# Patient Record
Sex: Female | Born: 1985 | Race: Black or African American | Hispanic: No | Marital: Married | Smoking: Never smoker
Health system: Southern US, Community
[De-identification: ages and names within clinical notes are randomized; demographics above are authoritative.]

## PROBLEM LIST (undated history)

## (undated) ENCOUNTER — Inpatient Hospital Stay (HOSPITAL_COMMUNITY): Payer: Self-pay

## (undated) DIAGNOSIS — G43909 Migraine, unspecified, not intractable, without status migrainosus: Secondary | ICD-10-CM

## (undated) DIAGNOSIS — M1990 Unspecified osteoarthritis, unspecified site: Secondary | ICD-10-CM

## (undated) DIAGNOSIS — J45909 Unspecified asthma, uncomplicated: Secondary | ICD-10-CM

## (undated) DIAGNOSIS — F419 Anxiety disorder, unspecified: Secondary | ICD-10-CM

## (undated) DIAGNOSIS — R030 Elevated blood-pressure reading, without diagnosis of hypertension: Secondary | ICD-10-CM

## (undated) DIAGNOSIS — F32A Depression, unspecified: Secondary | ICD-10-CM

## (undated) HISTORY — PX: NO PAST SURGERIES: SHX2092

---

## 2005-11-15 ENCOUNTER — Ambulatory Visit: Payer: Self-pay | Admitting: Emergency Medicine

## 2005-11-15 ENCOUNTER — Emergency Department: Payer: Self-pay | Admitting: Emergency Medicine

## 2009-10-12 ENCOUNTER — Inpatient Hospital Stay (HOSPITAL_COMMUNITY): Admission: AD | Admit: 2009-10-12 | Discharge: 2009-10-12 | Payer: Self-pay | Admitting: Obstetrics & Gynecology

## 2009-11-03 ENCOUNTER — Inpatient Hospital Stay (HOSPITAL_COMMUNITY): Admission: AD | Admit: 2009-11-03 | Discharge: 2009-11-05 | Payer: Self-pay | Admitting: Obstetrics and Gynecology

## 2011-04-03 LAB — RPR: RPR Ser Ql: NONREACTIVE

## 2011-04-03 LAB — CBC
MCHC: 34.6 g/dL (ref 30.0–36.0)
MCV: 98.5 fL (ref 78.0–100.0)
Platelets: 178 10*3/uL (ref 150–400)
Platelets: 212 10*3/uL (ref 150–400)
RDW: 11.7 % (ref 11.5–15.5)
RDW: 11.9 % (ref 11.5–15.5)
WBC: 7.9 10*3/uL (ref 4.0–10.5)

## 2014-02-21 ENCOUNTER — Emergency Department (HOSPITAL_COMMUNITY)
Admission: EM | Admit: 2014-02-21 | Discharge: 2014-02-21 | Disposition: A | Discharge status: Home / Self Care | Payer: No Typology Code available for payment source | Attending: Emergency Medicine | Admitting: Emergency Medicine

## 2014-02-21 ENCOUNTER — Encounter (HOSPITAL_COMMUNITY): Payer: Self-pay | Admitting: Emergency Medicine

## 2014-02-21 DIAGNOSIS — S161XXA Strain of muscle, fascia and tendon at neck level, initial encounter: Secondary | ICD-10-CM

## 2014-02-21 DIAGNOSIS — S139XXA Sprain of joints and ligaments of unspecified parts of neck, initial encounter: Secondary | ICD-10-CM | POA: Insufficient documentation

## 2014-02-21 DIAGNOSIS — Y929 Unspecified place or not applicable: Secondary | ICD-10-CM | POA: Insufficient documentation

## 2014-02-21 DIAGNOSIS — Y9389 Activity, other specified: Secondary | ICD-10-CM | POA: Insufficient documentation

## 2014-02-21 DIAGNOSIS — S0990XA Unspecified injury of head, initial encounter: Secondary | ICD-10-CM | POA: Insufficient documentation

## 2014-02-21 MED ORDER — HYDROCODONE-ACETAMINOPHEN 5-325 MG PO TABS: 2.0000 | ORAL_TABLET | Freq: Four times a day (QID) | ORAL | Status: DC | PRN

## 2014-02-21 MED ORDER — HYDROCODONE-ACETAMINOPHEN 5-325 MG PO TABS
2.0000 | ORAL_TABLET | Freq: Once | ORAL | Status: AC
  Administered 2014-02-21: 2 via ORAL
  Filled 2014-02-21: qty 2

## 2014-02-21 MED ORDER — CYCLOBENZAPRINE HCL 10 MG PO TABS: 10.0000 mg | ORAL_TABLET | Freq: Two times a day (BID) | ORAL | Status: DC | PRN

## 2014-02-21 NOTE — Discharge Instructions (Signed)
Cervical Strain and Sprain (Whiplash) °with Rehab °Cervical strain and sprains are injuries that commonly occur with "whiplash" injuries. Whiplash occurs when the neck is forcefully whipped backward or forward, such as during a motor vehicle accident. The muscles, ligaments, tendons, discs and nerves of the neck are susceptible to injury when this occurs. °SYMPTOMS  °· Pain or stiffness in the front and/or back of neck °· Symptoms may present immediately or up to 24 hours after injury. °· Dizziness, headache, nausea and vomiting. °· Muscle spasm with soreness and stiffness in the neck. °· Tenderness and swelling at the injury site. °CAUSES  °Whiplash injuries often occur during contact sports or motor vehicle accidents.  °RISK INCREASES WITH: °· Osteoarthritis of the spine. °· Situations that make head or neck accidents or trauma more likely. °· High-risk sports (football, rugby, wrestling, hockey, auto racing, gymnastics, diving, contact karate or boxing). °· Poor strength and flexibility of the neck. °· Previous neck injury. °· Poor tackling technique. °· Improperly fitted or padded equipment. °PREVENTION °· Learn and use proper technique (avoid tackling with the head, spearing and head-butting; use proper falling techniques to avoid landing on the head). °· Warm up and stretch properly before activity. °· Maintain physical fitness: °· Strength, flexibility and endurance. °· Cardiovascular fitness. °· Wear properly fitted and padded protective equipment, such as padded soft collars, for participation in contact sports. °PROGNOSIS  °Recovery for cervical strain and sprain injuries is dependent on the extent of the injury. These injuries are usually curable in 1 week to 3 months with appropriate treatment.  °RELATED COMPLICATIONS  °· Temporary numbness and weakness may occur if the nerve roots are damaged, and this may persist until the nerve has completely healed. °· Chronic pain due to frequent recurrence of  symptoms. °· Prolonged healing, especially if activity is resumed too soon (before complete recovery). °TREATMENT  °Treatment initially involves the use of ice and medication to help reduce pain and inflammation. It is also important to perform strengthening and stretching exercises and modify activities that worsen symptoms so the injury does not get worse. These exercises may be performed at home or with a therapist. For patients who experience severe symptoms, a soft padded collar may be recommended to be worn around the neck.  °Improving your posture may help reduce symptoms. Posture improvement includes pulling your chin and abdomen in while sitting or standing. If you are sitting, sit in a firm chair with your buttocks against the back of the chair. While sleeping, try replacing your pillow with a small towel rolled to 2 inches in diameter, or use a cervical pillow or soft cervical collar. Poor sleeping positions delay healing.  °For patients with nerve root damage, which causes numbness or weakness, the use of a cervical traction apparatus may be recommended. Surgery is rarely necessary for these injuries. However, cervical strain and sprains that are present at birth (congenital) may require surgery. °MEDICATION  °· If pain medication is necessary, nonsteroidal anti-inflammatory medications, such as aspirin and ibuprofen, or other minor pain relievers, such as acetaminophen, are often recommended. °· Do not take pain medication for 7 days before surgery. °· Prescription pain relievers may be given if deemed necessary by your caregiver. Use only as directed and only as much as you need. °HEAT AND COLD:  °· Cold treatment (icing) relieves pain and reduces inflammation. Cold treatment should be applied for 10 to 15 minutes every 2 to 3 hours for inflammation and pain and immediately after any activity that   aggravates your symptoms. Use ice packs or an ice massage. °· Heat treatment may be used prior to  performing the stretching and strengthening activities prescribed by your caregiver, physical therapist, or athletic trainer. Use a heat pack or a warm soak. °SEEK MEDICAL CARE IF:  °· Symptoms get worse or do not improve in 2 weeks despite treatment. °· New, unexplained symptoms develop (drugs used in treatment may produce side effects). °EXERCISES °RANGE OF MOTION (ROM) AND STRETCHING EXERCISES - Cervical Strain and Sprain °These exercises may help you when beginning to rehabilitate your injury. In order to successfully resolve your symptoms, you must improve your posture. These exercises are designed to help reduce the forward-head and rounded-shoulder posture which contributes to this condition. Your symptoms may resolve with or without further involvement from your physician, physical therapist or athletic trainer. While completing these exercises, remember:  °· Restoring tissue flexibility helps normal motion to return to the joints. This allows healthier, less painful movement and activity. °· An effective stretch should be held for at least 20 seconds, although you may need to begin with shorter hold times for comfort. °· A stretch should never be painful. You should only feel a gentle lengthening or release in the stretched tissue. °STRETCH- Axial Extensors °· Lie on your back on the floor. You may bend your knees for comfort. Place a rolled up hand towel or dish towel, about 2 inches in diameter, under the part of your head that makes contact with the floor. °· Gently tuck your chin, as if trying to make a "double chin," until you feel a gentle stretch at the base of your head. °· Hold __________ seconds. °Repeat __________ times. Complete this exercise __________ times per day.  °STRETECH - Axial Extension  °· Stand or sit on a firm surface. Assume a good posture: chest up, shoulders drawn back, abdominal muscles slightly tense, knees unlocked (if standing) and feet hip width apart. °· Slowly retract your  chin so your head slides back and your chin slightly lowers.Continue to look straight ahead. °· You should feel a gentle stretch in the back of your head. Be certain not to feel an aggressive stretch since this can cause headaches later. °· Hold for __________ seconds. °Repeat __________ times. Complete this exercise __________ times per day. °STRETCH  Cervical Side Bend  °· Stand or sit on a firm surface. Assume a good posture: chest up, shoulders drawn back, abdominal muscles slightly tense, knees unlocked (if standing) and feet hip width apart. °· Without letting your nose or shoulders move, slowly tip your right / left ear to your shoulder until your feel a gentle stretch in the muscles on the opposite side of your neck. °· Hold __________ seconds. °Repeat __________ times. Complete this exercise __________ times per day. °STRETCH  Cervical Rotators  °· Stand or sit on a firm surface. Assume a good posture: chest up, shoulders drawn back, abdominal muscles slightly tense, knees unlocked (if standing) and feet hip width apart. °· Keeping your eyes level with the ground, slowly turn your head until you feel a gentle stretch along the back and opposite side of your neck. °· Hold __________ seconds. °Repeat __________ times. Complete this exercise __________ times per day. °RANGE OF MOTION - Neck Circles  °· Stand or sit on a firm surface. Assume a good posture: chest up, shoulders drawn back, abdominal muscles slightly tense, knees unlocked (if standing) and feet hip width apart. °· Gently roll your head down and around from   the back of one shoulder to the back of the other. The motion should never be forced or painful. °· Repeat the motion 10-20 times, or until you feel the neck muscles relax and loosen. °Repeat __________ times. Complete the exercise __________ times per day. °STRENGTHENING EXERCISES - Cervical Strain and Sprain °These exercises may help you when beginning to rehabilitate your injury. They may  resolve your symptoms with or without further involvement from your physician, physical therapist or athletic trainer. While completing these exercises, remember:  °· Muscles can gain both the endurance and the strength needed for everyday activities through controlled exercises. °· Complete these exercises as instructed by your physician, physical therapist or athletic trainer. Progress the resistance and repetitions only as guided. °· You may experience muscle soreness or fatigue, but the pain or discomfort you are trying to eliminate should never worsen during these exercises. If this pain does worsen, stop and make certain you are following the directions exactly. If the pain is still present after adjustments, discontinue the exercise until you can discuss the trouble with your clinician. °STRENGTH Cervical Flexors, Isometric °· Face a wall, standing about 6 inches away. Place a small pillow, a ball about 6-8 inches in diameter, or a folded towel between your forehead and the wall. °· Slightly tuck your chin and gently push your forehead into the soft object. Push only with mild to moderate intensity, building up tension gradually. Keep your jaw and forehead relaxed. °· Hold 10 to 20 seconds. Keep your breathing relaxed. °· Release the tension slowly. Relax your neck muscles completely before you start the next repetition. °Repeat __________ times. Complete this exercise __________ times per day. °STRENGTH- Cervical Lateral Flexors, Isometric  °· Stand about 6 inches away from a wall. Place a small pillow, a ball about 6-8 inches in diameter, or a folded towel between the side of your head and the wall. °· Slightly tuck your chin and gently tilt your head into the soft object. Push only with mild to moderate intensity, building up tension gradually. Keep your jaw and forehead relaxed. °· Hold 10 to 20 seconds. Keep your breathing relaxed. °· Release the tension slowly. Relax your neck muscles completely before  you start the next repetition. °Repeat __________ times. Complete this exercise __________ times per day. °STRENGTH  Cervical Extensors, Isometric  °· Stand about 6 inches away from a wall. Place a small pillow, a ball about 6-8 inches in diameter, or a folded towel between the back of your head and the wall. °· Slightly tuck your chin and gently tilt your head back into the soft object. Push only with mild to moderate intensity, building up tension gradually. Keep your jaw and forehead relaxed. °· Hold 10 to 20 seconds. Keep your breathing relaxed. °· Release the tension slowly. Relax your neck muscles completely before you start the next repetition. °Repeat __________ times. Complete this exercise __________ times per day. °POSTURE AND BODY MECHANICS CONSIDERATIONS - Cervical Strain and Sprain °Keeping correct posture when sitting, standing or completing your activities will reduce the stress put on different body tissues, allowing injured tissues a chance to heal and limiting painful experiences. The following are general guidelines for improved posture. Your physician or physical therapist will provide you with any instructions specific to your needs. While reading these guidelines, remember: °· The exercises prescribed by your provider will help you have the flexibility and strength to maintain correct postures. °· The correct posture provides the optimal environment for your joints to   work. All of your joints have less wear and tear when properly supported by a spine with good posture. This means you will experience a healthier, less painful body. °· Correct posture must be practiced with all of your activities, especially prolonged sitting and standing. Correct posture is as important when doing repetitive low-stress activities (typing) as it is when doing a single heavy-load activity (lifting). °PROLONGED STANDING WHILE SLIGHTLY LEANING FORWARD °When completing a task that requires you to lean forward while  standing in one place for a long time, place either foot up on a stationary 2-4 inch high object to help maintain the best posture. When both feet are on the ground, the low back tends to lose its slight inward curve. If this curve flattens (or becomes too large), then the back and your other joints will experience too much stress, fatigue more quickly and can cause pain.  °RESTING POSITIONS °Consider which positions are most painful for you when choosing a resting position. If you have pain with flexion-based activities (sitting, bending, stooping, squatting), choose a position that allows you to rest in a less flexed posture. You would want to avoid curling into a fetal position on your side. If your pain worsens with extension-based activities (prolonged standing, working overhead), avoid resting in an extended position such as sleeping on your stomach. Most people will find more comfort when they rest with their spine in a more neutral position, neither too rounded nor too arched. Lying on a non-sagging bed on your side with a pillow between your knees, or on your back with a pillow under your knees will often provide some relief. Keep in mind, being in any one position for a prolonged period of time, no matter how correct your posture, can still lead to stiffness. °WALKING °Walk with an upright posture. Your ears, shoulders and hips should all line-up. °OFFICE WORK °When working at a desk, create an environment that supports good, upright posture. Without extra support, muscles fatigue and lead to excessive strain on joints and other tissues. °CHAIR: °· A chair should be able to slide under your desk when your back makes contact with the back of the chair. This allows you to work closely. °· The chair's height should allow your eyes to be level with the upper part of your monitor and your hands to be slightly lower than your elbows. °· Body position: °· Your feet should make contact with the floor. If this is  not possible, use a foot rest. °· Keep your ears over your shoulders. This will reduce stress on your neck and low back. °Document Released: 12/16/2005 Document Revised: 04/12/2013 Document Reviewed: 03/30/2009 °[employer]® Patient Information ©2014 [employer]. °Motor Vehicle Collision  °It is common to have multiple bruises and sore muscles after a motor vehicle collision (MVC). These tend to feel worse for the first 24 hours. You may have the most stiffness and soreness over the first several hours. You may also feel worse when you wake up the first morning after your collision. After this point, you will usually begin to improve with each day. The speed of improvement often depends on the severity of the collision, the number of injuries, and the location and nature of these injuries. °HOME CARE INSTRUCTIONS  °· Put ice on the injured area. °· Put ice in a plastic bag. °· Place a towel between your skin and the bag. °· Leave the ice on for 15-20 minutes, 03-04 times a day. °· Drink enough fluids to   keep your urine clear or pale yellow. Do not drink alcohol. °· Take a warm shower or bath once or twice a day. This will increase blood flow to sore muscles. °· You may return to activities as directed by your caregiver. Be careful when lifting, as this may aggravate neck or back pain. °· Only take over-the-counter or prescription medicines for pain, discomfort, or fever as directed by your caregiver. Do not use aspirin. This may increase bruising and bleeding. °SEEK IMMEDIATE MEDICAL CARE IF: °· You have numbness, tingling, or weakness in the arms or legs. °· You develop severe headaches not relieved with medicine. °· You have severe neck pain, especially tenderness in the middle of the back of your neck. °· You have changes in bowel or bladder control. °· There is increasing pain in any area of the body. °· You have shortness of breath, lightheadedness, dizziness, or fainting. °· You have chest pain. °· You feel  sick to your stomach (nauseous), throw up (vomit), or sweat. °· You have increasing abdominal discomfort. °· There is blood in your urine, stool, or vomit. °· You have pain in your shoulder (shoulder strap areas). °· You feel your symptoms are getting worse. °MAKE SURE YOU:  °· Understand these instructions. °· Will watch your condition. °· Will get help right away if you are not doing well or get worse. °Document Released: 12/16/2005 Document Revised: 03/09/2012 Document Reviewed: 05/15/2011 °[employer]® Patient Information ©2014 [employer]. ° °
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
## 2014-02-21 NOTE — ED Provider Notes (Signed)
Medical screening examination/treatment/procedure(s) were performed by non-physician practitioner and as supervising physician I was immediately available for consultation/collaboration.  EKG Interpretation   None         Jayelle Page L Shameka Aggarwal, MD 02/21/14 1521 
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
## 2014-02-28 ENCOUNTER — Other Ambulatory Visit (HOSPITAL_COMMUNITY): Payer: Self-pay | Admitting: Chiropractic Medicine

## 2014-02-28 ENCOUNTER — Ambulatory Visit (HOSPITAL_COMMUNITY)
Admission: RE | Admit: 2014-02-28 | Discharge: 2014-02-28 | Disposition: A | Discharge status: Home / Self Care | Payer: No Typology Code available for payment source | Source: Ambulatory Visit | Attending: Chiropractic Medicine | Admitting: Chiropractic Medicine

## 2014-02-28 DIAGNOSIS — R6884 Jaw pain: Secondary | ICD-10-CM | POA: Insufficient documentation

## 2014-02-28 DIAGNOSIS — M549 Dorsalgia, unspecified: Secondary | ICD-10-CM

## 2014-02-28 DIAGNOSIS — M545 Low back pain, unspecified: Secondary | ICD-10-CM

## 2014-02-28 DIAGNOSIS — M542 Cervicalgia: Secondary | ICD-10-CM

## 2014-08-30 ENCOUNTER — Encounter (HOSPITAL_COMMUNITY): Payer: Self-pay | Admitting: Emergency Medicine

## 2014-08-30 ENCOUNTER — Emergency Department (HOSPITAL_COMMUNITY)
Admission: EM | Admit: 2014-08-30 | Discharge: 2014-08-30 | Disposition: A | Discharge status: Home / Self Care | Payer: Medicaid Other | Attending: Emergency Medicine | Admitting: Emergency Medicine

## 2014-08-30 DIAGNOSIS — Z3490 Encounter for supervision of normal pregnancy, unspecified, unspecified trimester: Secondary | ICD-10-CM

## 2014-08-30 DIAGNOSIS — O210 Mild hyperemesis gravidarum: Secondary | ICD-10-CM | POA: Insufficient documentation

## 2014-08-30 DIAGNOSIS — IMO0002 Reserved for concepts with insufficient information to code with codable children: Secondary | ICD-10-CM | POA: Diagnosis not present

## 2014-08-30 DIAGNOSIS — R112 Nausea with vomiting, unspecified: Secondary | ICD-10-CM

## 2014-08-30 DIAGNOSIS — R630 Anorexia: Secondary | ICD-10-CM | POA: Insufficient documentation

## 2014-08-30 DIAGNOSIS — R51 Headache: Secondary | ICD-10-CM | POA: Diagnosis not present

## 2014-08-30 DIAGNOSIS — R42 Dizziness and giddiness: Secondary | ICD-10-CM | POA: Insufficient documentation

## 2014-08-30 DIAGNOSIS — O9989 Other specified diseases and conditions complicating pregnancy, childbirth and the puerperium: Secondary | ICD-10-CM | POA: Diagnosis present

## 2014-08-30 LAB — COMPREHENSIVE METABOLIC PANEL
ALT: 8 U/L (ref 0–35)
AST: 12 U/L (ref 0–37)
Albumin: 3.7 g/dL (ref 3.5–5.2)
Alkaline Phosphatase: 45 U/L (ref 39–117)
Anion gap: 13 (ref 5–15)
BUN: 9 mg/dL (ref 6–23)
CO2: 21 mEq/L (ref 19–32)
Calcium: 8.8 mg/dL (ref 8.4–10.5)
Chloride: 103 mEq/L (ref 96–112)
Creatinine, Ser: 0.72 mg/dL (ref 0.50–1.10)
GFR calc Af Amer: >90 mL/min (ref >90)
GFR calc non Af Amer: >90 mL/min (ref >90)
Glucose, Bld: 88 mg/dL (ref 70–99)
Potassium: 3.8 mEq/L (ref 3.7–5.3)
Sodium: 137 mEq/L (ref 137–147)
Total Bilirubin: 0.2 mg/dL — ABNORMAL LOW (ref 0.3–1.2)
Total Protein: 7.1 g/dL (ref 6.0–8.3)

## 2014-08-30 LAB — CBC WITH DIFFERENTIAL/PLATELET
Basophils Absolute: 0 10*3/uL (ref 0.0–0.1)
Basophils Relative: 0 % (ref 0–1)
Eosinophils Absolute: 0 10*3/uL (ref 0.0–0.7)
Eosinophils Relative: 1 % (ref 0–5)
HCT: 37.6 % (ref 36.0–46.0)
Hemoglobin: 13.3 g/dL (ref 12.0–15.0)
Lymphocytes Relative: 34 % (ref 12–46)
Lymphs Abs: 1.7 10*3/uL (ref 0.7–4.0)
MCH: 32.4 pg (ref 26.0–34.0)
MCHC: 35.4 g/dL (ref 30.0–36.0)
MCV: 91.7 fL (ref 78.0–100.0)
Monocytes Absolute: 0.5 10*3/uL (ref 0.1–1.0)
Monocytes Relative: 9 % (ref 3–12)
Neutro Abs: 2.9 10*3/uL (ref 1.7–7.7)
Neutrophils Relative %: 56 % (ref 43–77)
Platelets: 248 10*3/uL (ref 150–400)
RBC: 4.1 MIL/uL (ref 3.87–5.11)
RDW: 11.5 % (ref 11.5–15.5)
WBC: 5.1 10*3/uL (ref 4.0–10.5)

## 2014-08-30 LAB — URINALYSIS, ROUTINE W REFLEX MICROSCOPIC
Bilirubin Urine: NEGATIVE
Glucose, UA: NEGATIVE mg/dL
Ketones, ur: NEGATIVE mg/dL
Leukocytes, UA: NEGATIVE
Nitrite: NEGATIVE
Protein, ur: NEGATIVE mg/dL
Specific Gravity, Urine: 1.028 (ref 1.005–1.030)
Urobilinogen, UA: 1 mg/dL (ref 0.0–1.0)
pH: 5.5 (ref 5.0–8.0)

## 2014-08-30 LAB — URINE MICROSCOPIC-ADD ON

## 2014-08-30 LAB — POC URINE PREG, ED: Preg Test, Ur: POSITIVE — AB

## 2014-08-30 LAB — HCG, QUANTITATIVE, PREGNANCY: hCG, Beta Chain, Quant, S: 64853 m[IU]/mL — ABNORMAL HIGH (ref <5)

## 2014-08-30 MED ORDER — MECLIZINE HCL 25 MG PO TABS: 25.0000 mg | ORAL_TABLET | Freq: Three times a day (TID) | ORAL | Status: DC | PRN

## 2014-08-30 MED ORDER — MECLIZINE HCL 25 MG PO TABS
25.0000 mg | ORAL_TABLET | Freq: Once | ORAL | Status: AC
  Administered 2014-08-30: 25 mg via ORAL
  Filled 2014-08-30: qty 1

## 2014-08-30 MED ORDER — PRENATAL VITAMINS 28-0.8 MG PO TABS: 1.0000 | ORAL_TABLET | Freq: Every day | ORAL | Status: DC

## 2014-08-30 MED ORDER — SODIUM CHLORIDE 0.9 % IV BOLUS (SEPSIS)
1000.0000 mL | Freq: Once | INTRAVENOUS | Status: AC
  Administered 2014-08-30: 1000 mL via INTRAVENOUS

## 2014-08-30 NOTE — ED Notes (Signed)
Iv removed from rt ac, 2x2 gauze applied with tape.

## 2014-08-30 NOTE — ED Notes (Signed)
Pt here for dizziness and morning sickness, sts has had several positive pregnancy test, this is 3rd pregnancy and sts that previous pregnancy did not have dizziness.

## 2014-08-30 NOTE — ED Notes (Signed)
PA at the bedside.

## 2014-08-30 NOTE — ED Provider Notes (Signed)
CSN: [id number]     Arrival date & time 08/30/14  1328 History   First MD Initiated Contact with Patient 08/30/14 1713     Chief Complaint  Patient presents with  . Dizziness     (Consider location/radiation/quality/duration/timing/severity/associated sxs/prior Treatment) HPI Pt is a 28yo female G4T2PA1L2 presenting to ED with c/o 3 day hx of dizziness, nausea, vomiting and intermittent frontal headache. Pt reports 2 episodes of emesis this morning and increased dizziness with certain head movements. States when she walks she feels increased dizziness, "like the room is spinning." denies previous hx of vertigo. States her last pregnancies were uneventful and had a volentary termination of her first pregnancy.  Denies chest, back, abdominal or vaginal pain. Denies urinary symptoms. Denies vaginal bleeding or discharge. Denies other significant PMH. Denies hx of recent illness. Pt does report decreased appetite due to nausea. Denies taking any medications at home for nausea. States she did have a OB/GYN visit scheduled for today [facility] but states she came here instead due to the dizziness. Denies calling health department prior to coming to ED.  History reviewed. No pertinent past medical history. History reviewed. No pertinent past surgical history. History reviewed. No pertinent family history. History  Substance Use Topics  . Smoking status: Never Smoker   . Smokeless tobacco: Not on file  . Alcohol Use: No   OB History   Grav Para Term Preterm Abortions TAB SAB Ect Mult Living                 Review of Systems  Constitutional: Positive for appetite change ( decreased due to nausea). Negative for fever and chills.  Eyes: Negative for photophobia, pain, redness and visual disturbance.  Respiratory: Negative for cough and shortness of breath.   Cardiovascular: Negative for chest pain, palpitations and leg swelling.  Gastrointestinal: Positive  for nausea and vomiting ( x2 today). Negative for abdominal pain, diarrhea and constipation.  Genitourinary: Negative for dysuria, urgency, frequency, hematuria, flank pain, decreased urine volume, vaginal bleeding, vaginal discharge, vaginal pain, menstrual problem and pelvic pain.  Musculoskeletal: Negative for back pain and myalgias.  Neurological: Positive for dizziness and headaches ( intermittent, frontal). Negative for syncope, weakness, light-headedness and numbness.  All other systems reviewed and are negative.     Allergies  Shrimp  Home Medications   Prior to Admission medications   Medication Sig Start Date End Date Taking? Authorizing Provider  cyclobenzaprine (FLEXERIL) 10 MG tablet Take 1 tablet (10 mg total) by mouth 2 (two) times daily as needed for muscle spasms. 02/21/14   Roxy Horseman, PA-C  Fluticasone-Salmeterol (ADVAIR) 250-50 MCG/DOSE AEPB Inhale 2 puffs into the lungs daily as needed (wheezing).    Historical Provider, MD  HYDROcodone-acetaminophen (NORCO/VICODIN) 5-325 MG per tablet Take 2 tablets by mouth every 6 (six) hours as needed. 02/21/14   Roxy Horseman, PA-C  meclizine (ANTIVERT) 25 MG tablet Take 1 tablet (25 mg total) by mouth 3 (three) times daily as needed for dizziness. 08/30/14   Junius Finner, PA-C  Prenatal Vit-Fe Fumarate-FA (PRENATAL VITAMINS) 28-0.8 MG TABS Take 1 capsule by mouth daily. 08/30/14   Junius Finner, PA-C  triprolidine-pseudoephedrine (APRODINE) 2.5-60 MG TABS Take 0.5-1 tablets by mouth daily as needed for allergies.    Historical Provider, MD  vitamin C (ASCORBIC ACID) 500 MG tablet Take 1,000 mg by mouth once.    Historical Provider, MD   BP 110/63  Pulse 57  Temp(Src) 98.6 F (37  C) (Oral)  Resp 15  Ht  (1.6 m)  Wt 145 lb (65.772 kg)  BMI 25.69 kg/m2  SpO2 100%  LMP 07/21/2014 Physical Exam  Nursing note and vitals reviewed. Constitutional: She is oriented to person, place, and time. She appears well-developed and  well-nourished. No distress.  HENT:  Head: Normocephalic and atraumatic.  Eyes: Conjunctivae and EOM are normal. Pupils are equal, round, and reactive to light. No scleral icterus.  Increased dizziness with EOMs and head movement  Neck: Normal range of motion. Neck supple.  No nuchal rigidity or meningeal signs.  Cardiovascular: Normal rate, regular rhythm and normal heart sounds.   Pulmonary/Chest: Effort normal and breath sounds normal. No respiratory distress. She has no wheezes. She has no rales. She exhibits no tenderness.  Abdominal: Soft. Bowel sounds are normal. She exhibits no distension and no mass. There is no tenderness. There is no rebound and no guarding.  Musculoskeletal: Normal range of motion.  Neurological: She is alert and oriented to person, place, and time. She has normal strength. No cranial nerve deficit or sensory deficit. She displays a negative Romberg sign. Gait normal. GCS eye subscore is 4. GCS verbal subscore is 5. GCS motor subscore is 6.  Skin: Skin is warm and dry. She is not diaphoretic.    ED Course  Procedures (including critical care time) Labs Review Labs Reviewed  COMPREHENSIVE METABOLIC PANEL - Abnormal; Notable for the following:    Total Bilirubin 0.2 (*)    All other components within normal limits  URINALYSIS, ROUTINE W REFLEX MICROSCOPIC - Abnormal; Notable for the following:    APPearance CLOUDY (*)    Hgb urine dipstick SMALL (*)    All other components within normal limits  URINE MICROSCOPIC-ADD ON - Abnormal; Notable for the following:    Squamous Epithelial / LPF MANY (*)    Bacteria, UA FEW (*)    All other components within normal limits  HCG, QUANTITATIVE, PREGNANCY - Abnormal; Notable for the following:    hCG, Beta Chain, Quant, S 16109 (*)    All other components within normal limits  POC URINE PREG, ED - Abnormal; Notable for the following:    Preg Test, Ur POSITIVE (*)    All other components within normal limits  CBC WITH  DIFFERENTIAL    Imaging Review No results found.   EKG Interpretation None      MDM   Final diagnoses:  Vertigo  Pregnancy  Non-intractable vomiting with nausea, vomiting of unspecified type    Pt is a 28yo female, pregnant for 4th time, presenting to ED c/o dizziness, worse with ambulating and certain head movements. Also associated with intermittent frontal headache, nausea and vomiting. Pt denies abdominal pain, vaginal pain, bleeding or discharge. Denies urinary symptoms.  Exam consistent with benign paroxysmal positional vertigo. Not concerned for ectopic pregnancy, ovarian torsion, SAH, CVA, or other emergent process taking place at this time. Pt given IV fluids and meclizine which helped with pt's symptoms. Labs reviewed: unremarkable.  Discussed pt with Dr. Juleen China, pt may be discharged home to f/u with OB/GYN.  Pt discharged home with prenatal vitamins and meclizine. Return precautions provided. Pt verbalized understanding and agreement with tx plan.    Junius Finner, PA-C 08/30/14 [id number]

## 2014-08-31 NOTE — ED Provider Notes (Signed)
Medical screening examination/treatment/procedure(s) were performed by non-physician practitioner and as supervising physician I was immediately available for consultation/collaboration.   EKG Interpretation None       Aidian Salomon, MD 08/31/14 0152 

## 2014-09-03 ENCOUNTER — Encounter (HOSPITAL_COMMUNITY): Payer: Self-pay | Admitting: Emergency Medicine

## 2014-09-03 ENCOUNTER — Emergency Department (HOSPITAL_COMMUNITY): Payer: Medicaid Other

## 2014-09-03 ENCOUNTER — Emergency Department (HOSPITAL_COMMUNITY)
Admission: EM | Admit: 2014-09-03 | Discharge: 2014-09-03 | Disposition: A | Discharge status: Home / Self Care | Payer: Medicaid Other | Attending: Emergency Medicine | Admitting: Emergency Medicine

## 2014-09-03 DIAGNOSIS — O368990 Maternal care for other specified fetal problems, unspecified trimester, not applicable or unspecified: Secondary | ICD-10-CM | POA: Diagnosis not present

## 2014-09-03 DIAGNOSIS — Z79899 Other long term (current) drug therapy: Secondary | ICD-10-CM | POA: Insufficient documentation

## 2014-09-03 DIAGNOSIS — IMO0002 Reserved for concepts with insufficient information to code with codable children: Secondary | ICD-10-CM | POA: Diagnosis not present

## 2014-09-03 DIAGNOSIS — O30001 Twin pregnancy, unspecified number of placenta and unspecified number of amniotic sacs, first trimester: Secondary | ICD-10-CM

## 2014-09-03 DIAGNOSIS — O209 Hemorrhage in early pregnancy, unspecified: Secondary | ICD-10-CM | POA: Diagnosis present

## 2014-09-03 DIAGNOSIS — O200 Threatened abortion: Secondary | ICD-10-CM | POA: Diagnosis not present

## 2014-09-03 DIAGNOSIS — O43899 Other placental disorders, unspecified trimester: Secondary | ICD-10-CM | POA: Diagnosis not present

## 2014-09-03 DIAGNOSIS — O30009 Twin pregnancy, unspecified number of placenta and unspecified number of amniotic sacs, unspecified trimester: Secondary | ICD-10-CM | POA: Insufficient documentation

## 2014-09-03 DIAGNOSIS — O468X1 Other antepartum hemorrhage, first trimester: Secondary | ICD-10-CM

## 2014-09-03 DIAGNOSIS — O418X10 Other specified disorders of amniotic fluid and membranes, first trimester, not applicable or unspecified: Secondary | ICD-10-CM

## 2014-09-03 LAB — BASIC METABOLIC PANEL
ANION GAP: 10 (ref 5–15)
BUN: 11 mg/dL (ref 6–23)
CO2: 22 mEq/L (ref 19–32)
Calcium: 8.7 mg/dL (ref 8.4–10.5)
Chloride: 104 mEq/L (ref 96–112)
Creatinine, Ser: 0.75 mg/dL (ref 0.50–1.10)
Glucose, Bld: 94 mg/dL (ref 70–99)
POTASSIUM: 4.1 meq/L (ref 3.7–5.3)
SODIUM: 136 meq/L — AB (ref 137–147)

## 2014-09-03 LAB — WET PREP, GENITAL
Trich, Wet Prep: NONE SEEN
Yeast Wet Prep HPF POC: NONE SEEN

## 2014-09-03 LAB — OB RESULTS CONSOLE GC/CHLAMYDIA
Chlamydia: NEGATIVE
Gonorrhea: NEGATIVE

## 2014-09-03 LAB — CBC
HCT: 36.1 % (ref 36.0–46.0)
HEMOGLOBIN: 13.1 g/dL (ref 12.0–15.0)
MCH: 32.3 pg (ref 26.0–34.0)
MCHC: 36.3 g/dL — ABNORMAL HIGH (ref 30.0–36.0)
MCV: 89.1 fL (ref 78.0–100.0)
Platelets: 251 10*3/uL (ref 150–400)
RBC: 4.05 MIL/uL (ref 3.87–5.11)
RDW: 11.5 % (ref 11.5–15.5)
WBC: 5.4 10*3/uL (ref 4.0–10.5)

## 2014-09-03 LAB — URINALYSIS, ROUTINE W REFLEX MICROSCOPIC
Bilirubin Urine: NEGATIVE
Glucose, UA: NEGATIVE mg/dL
KETONES UR: NEGATIVE mg/dL
LEUKOCYTES UA: NEGATIVE
NITRITE: NEGATIVE
Protein, ur: NEGATIVE mg/dL
Specific Gravity, Urine: 1.03 (ref 1.005–1.030)
UROBILINOGEN UA: 0.2 mg/dL (ref 0.0–1.0)
pH: 5.5 (ref 5.0–8.0)

## 2014-09-03 LAB — URINE MICROSCOPIC-ADD ON

## 2014-09-03 LAB — ABO/RH: ABO/RH(D): B POS

## 2014-09-03 LAB — HCG, QUANTITATIVE, PREGNANCY: hCG, Beta Chain, Quant, S: 120366 m[IU]/mL — ABNORMAL HIGH (ref <5)

## 2014-09-03 NOTE — ED Provider Notes (Signed)
CSN: [id number]     Arrival date & time 09/03/14  0137 History   First MD Initiated Contact with Patient 09/03/14 [phone]     Chief Complaint  Patient presents with  . Pelvic Pain  . Vaginal Bleeding     (Consider location/radiation/quality/duration/timing/severity/associated sxs/prior Treatment) HPI Comments: Patient is a 28 y/o [id number] female, currently pregnant (LMP 07/21/14) who presents for further evaluation of vaginal bleeding. Patient states that she awoke from a nap and went to take a shower at 0000. Upon getting into the shower she noticed she was experiencing bright red vaginal bleeding. She endorses blood in her undergarments and pants. Patient denies clots. She states she also experienced pelvic cramping when urinating shortly after showering. Bleeding lasted for approximately 1-2 hours before spontaneously resolving. Patient denies bleeding at present. She denies any pain, currently. Patient denies associated fever, CP, SOB, syncope, dysuria, hematuria, N/V/D, melena, hematochezia, and numbness/weakness. Patient denies any complications with prior pregnancies. She has yet to see her OBGYN for this pregnancy.  Patient is a 28 y.o. female presenting with pelvic pain and vaginal bleeding. The history is provided by the patient. No language interpreter was used.  Pelvic Pain  Vaginal Bleeding   History reviewed. No pertinent past medical history. History reviewed. No pertinent past surgical history. No family history on file. History  Substance Use Topics  . Smoking status: Never Smoker   . Smokeless tobacco: Not on file  . Alcohol Use: No   OB History   Grav Para Term Preterm Abortions TAB SAB Ect Mult Living                  Review of Systems  Genitourinary: Positive for vaginal bleeding and pelvic pain.  All other systems reviewed and are negative.    Allergies  Shrimp  Home Medications   Prior to Admission medications   Medication Sig Start Date End Date Taking?  Authorizing Provider  cyclobenzaprine (FLEXERIL) 10 MG tablet Take 1 tablet (10 mg total) by mouth 2 (two) times daily as needed for muscle spasms. 02/21/14  Yes Roxy Horseman, PA-C  Fluticasone-Salmeterol (ADVAIR) 250-50 MCG/DOSE AEPB Inhale 2 puffs into the lungs daily as needed (wheezing).   Yes Historical Provider, MD  HYDROcodone-acetaminophen (NORCO/VICODIN) 5-325 MG per tablet Take 2 tablets by mouth every 6 (six) hours as needed. 02/21/14  Yes Roxy Horseman, PA-C  meclizine (ANTIVERT) 25 MG tablet Take 1 tablet (25 mg total) by mouth 3 (three) times daily as needed for dizziness. 08/30/14  Yes Junius Finner, PA-C  Prenatal Vit-Fe Fumarate-FA (PRENATAL VITAMINS) 28-0.8 MG TABS Take 1 capsule by mouth daily. 08/30/14  Yes Junius Finner, PA-C  triprolidine-pseudoephedrine (APRODINE) 2.5-60 MG TABS Take 0.5-1 tablets by mouth daily as needed for allergies.   Yes Historical Provider, MD   BP 96/65  Pulse 78  Temp(Src) 97.8 F (36.6 C) (Oral)  Resp 18  Ht  (1.6 m)  Wt 143 lb 3.2 oz (64.955 kg)  BMI 25.37 kg/m2  SpO2 100%  LMP 07/21/2014  Physical Exam  Nursing note and vitals reviewed. Constitutional: She is oriented to person, place, and time. She appears well-developed and well-nourished. No distress.  Nontoxic/nonseptic appearing  HENT:  Head: Normocephalic and atraumatic.  Eyes: Conjunctivae and EOM are normal. No scleral icterus.  Neck: Normal range of motion.  Cardiovascular: Normal rate, regular rhythm and intact distal pulses.   Pulmonary/Chest: Effort normal. No respiratory distress. She has no wheezes.  Chest expansion symmetric  Abdominal: Soft. There is  no tenderness. There is no rebound and no guarding.  Soft and nontender abdomen. No masses or guarding. No peritoneal signs.  Genitourinary: There is no rash, tenderness, lesion or injury on the right labia. There is no rash, tenderness, lesion or injury on the left labia. Uterus is not tender. Cervix exhibits no  motion tenderness and no friability. Right adnexum displays no mass, no tenderness and no fullness. Left adnexum displays tenderness (mild). Left adnexum displays no mass. No erythema around the vagina. No signs of injury around the vagina. No vaginal discharge found.  Mild L adnexal TTP. Blood appreciated in vaginal vault without clots or evidence of POC.  Musculoskeletal: Normal range of motion.  Neurological: She is alert and oriented to person, place, and time. She exhibits normal muscle tone. Coordination normal.  GCS 15. Patient moves extremities without ataxia.  Skin: Skin is warm and dry. No rash noted. She is not diaphoretic. No erythema. No pallor.  Psychiatric: She has a normal mood and affect. Her behavior is normal.    ED Course  Procedures (including critical care time) Labs Review Labs Reviewed  WET PREP, GENITAL - Abnormal; Notable for the following:    Clue Cells Wet Prep HPF POC FEW (*)    WBC, Wet Prep HPF POC FEW (*)    All other components within normal limits  CBC - Abnormal; Notable for the following:    MCHC 36.3 (*)    All other components within normal limits  HCG, QUANTITATIVE, PREGNANCY - Abnormal; Notable for the following:    hCG, Beta Chain, [place] Longest 161096 (*)    All other components within normal limits  BASIC METABOLIC PANEL - Abnormal; Notable for the following:    Sodium 136 (*)    All other components within normal limits  URINALYSIS, ROUTINE W REFLEX MICROSCOPIC - Abnormal; Notable for the following:    APPearance CLOUDY (*)    Hgb urine dipstick MODERATE (*)    All other components within normal limits  URINE MICROSCOPIC-ADD ON - Abnormal; Notable for the following:    Squamous Epithelial / LPF MANY (*)    All other components within normal limits  GC/CHLAMYDIA PROBE AMP  ABO/RH    Imaging Review US Ob Comp Less 14 Wks  09/03/2014   CLINICAL DATA:  Pelvic pain and vaginal bleeding. Estimated gestational age by LMP is 6 weeks 2 days.  Quantitative beta HCG is 120,366.  EXAM: TWIN OBSTETRIC <14WK [place] AND TRANSVAGINAL OB US  COMPARISON:  None.  FINDINGS: Twin intrauterine gestational sacs are present.  TWIN 1  Intrauterine gestational sac: Intrauterine gestational sac located anteriorly and slightly smaller to twin 2.  Yolk sac:  Present  Embryo:  Present  Cardiac Activity: Present  Heart Rate: 132 bpm  CRL:  9.7  mm   7 w 0 d                  [place] EDC: 04/22/2015  TWIN 2  Intrauterine gestational sac: Intrauterine gestational sac located posteriorly and slightly larger with respect to twin 1.  Yolk sac:  Present  Embryo:  Present  Cardiac Activity: Present  Heart Rate: 130 bpm  CRL:  9.4  mm   7 w 0 d                  [place] EDC: 04/22/2015  Maternal uterus/adnexae: The uterus is anteverted. It a small to moderate-sized subchorionic hemorrhage is demonstrated inferiorly and extending around gestational sac 2. Both  ovaries are visualized. The right ovary measures 4.6 x 2.5 x 2.5 cm. Left ovary measures 3.8 x 2 x 1.2 cm. No abnormal adnexal masses. No free pelvic fluid collections.  IMPRESSION: Twin intrauterine pregnancy. Two fetal poles are demonstrated, each suggesting 7 weeks 0 days estimated gestational age by crown-rump length. A moderate size subchorionic hemorrhage is present.   Electronically Signed   By: Burman Nieves M.D.   On: 09/03/2014 06:25   US Ob Transvaginal  09/03/2014   CLINICAL DATA:  Pelvic pain and vaginal bleeding. Estimated gestational age by LMP is 6 weeks 2 days. Quantitative beta HCG is 120,366.  EXAM: TWIN OBSTETRIC <14WK [place] AND TRANSVAGINAL OB US  COMPARISON:  None.  FINDINGS: Twin intrauterine gestational sacs are present.  TWIN 1  Intrauterine gestational sac: Intrauterine gestational sac located anteriorly and slightly smaller to twin 2.  Yolk sac:  Present  Embryo:  Present  Cardiac Activity: Present  Heart Rate: 132 bpm  CRL:  9.7  mm   7 w 0 d                  [place] EDC: 04/22/2015  TWIN 2  Intrauterine gestational  sac: Intrauterine gestational sac located posteriorly and slightly larger with respect to twin 1.  Yolk sac:  Present  Embryo:  Present  Cardiac Activity: Present  Heart Rate: 130 bpm  CRL:  9.4  mm   7 w 0 d                  [place] EDC: 04/22/2015  Maternal uterus/adnexae: The uterus is anteverted. It a small to moderate-sized subchorionic hemorrhage is demonstrated inferiorly and extending around gestational sac 2. Both ovaries are visualized. The right ovary measures 4.6 x 2.5 x 2.5 cm. Left ovary measures 3.8 x 2 x 1.2 cm. No abnormal adnexal masses. No free pelvic fluid collections.  IMPRESSION: Twin intrauterine pregnancy. Two fetal poles are demonstrated, each suggesting 7 weeks 0 days estimated gestational age by crown-rump length. A moderate size subchorionic hemorrhage is present.   Electronically Signed   By: Burman Nieves M.D.   On: 09/03/2014 06:25     EKG Interpretation None      MDM   Final diagnoses:  Subchorionic hemorrhage in first trimester  Threatened abortion in first trimester  Twin gestation in first trimester    28 year old female presents to the emergency department for further evaluation of vaginal bleeding. Patient endorses to be approximately [redacted] weeks pregnant. Physical examination today is reassuring. Patient has no abdominal tenderness or peritoneal signs. No evidence of acute blood loss and: No tachycardia, hypotension, anemia, or elevated BUN. Patient's blood type is B+. She does not require RhoGAM infusion. Pelvic ultrasound ordered for further evaluation of symptoms which shows twin intrauterine pregnancy with moderate subchorionic hemorrhage.  Findings reviewed with the patient at length. She verbalizes understanding of results today. Patient denies any continuation of bleeding. She is stable for discharge with instruction to follow up with her OB/GYN. I have advised pelvic rest as well as Tylenol for pain control as needed. Return precautions discussed and patient  agreeable to plan with no unaddressed concerns.   Filed Vitals:   09/03/14 0145 09/03/14 0315 09/03/14 0619  BP: 117/68 115/74 96/65  Pulse: 70 78   Temp: 98.4 F (36.9 C) 98.3 F (36.8 C) 97.8 F (36.6 C)  TempSrc: Oral Oral Oral  Resp: Height:  (1.6 m)    Weight: 143  lb 3.2 oz (64.955 kg)    SpO2: 99% 100% 100%       Antony Madura, PA-C 09/03/14 [phone]

## 2014-09-03 NOTE — Discharge Instructions (Signed)
Recommend pelvic rest and that you refrain from sexual intercourse. Follow up with your OBGYN for further evaluation of symptoms. Return to the ED as needed if symptoms worsen.  Pelvic Rest Pelvic rest is sometimes recommended for women when:   The placenta is partially or completely covering the opening of the cervix (placenta previa).  There is bleeding between the uterine wall and the amniotic sac in the first trimester (subchorionic hemorrhage).  The cervix begins to open without labor starting (incompetent cervix, cervical insufficiency).  The labor is too early (preterm labor). HOME CARE INSTRUCTIONS  Do not have sexual intercourse, stimulation, or an orgasm.  Do not use tampons, douche, or put anything in the vagina.  Do not lift anything over 10 pounds (4.5 kg).  Avoid strenuous activity or straining your pelvic muscles. SEEK MEDICAL CARE IF:  You have any vaginal bleeding during pregnancy. Treat this as a potential emergency.  You have cramping pain felt low in the stomach (stronger than menstrual cramps).  You notice vaginal discharge (watery, mucus, or bloody).  You have a low, dull backache.  There are regular contractions or uterine tightening. SEEK IMMEDIATE MEDICAL CARE IF: You have vaginal bleeding and have placenta previa.  Document Released: 04/12/2011 Document Revised: 03/09/2012 Document Reviewed: 04/12/2011 [facility] Patient Information 2015 [place], [place]. This information is not intended to replace advice given to you by your health care provider. Make sure you discuss any questions you have with your health care provider.

## 2014-09-03 NOTE — ED Notes (Signed)
Patient here with complaint of vaginal bleeding and abdominal pain which she noticed when waking around 30 mins ago. States that she was recently found to be pregnant. Est gestation 6wks.

## 2014-09-04 NOTE — ED Provider Notes (Signed)
Medical screening examination/treatment/procedure(s) were performed by non-physician practitioner and as supervising physician I was immediately available for consultation/collaboration.   EKG Interpretation None        Riven Mabile, MD 09/04/14 0656 

## 2014-09-07 LAB — GC/CHLAMYDIA PROBE AMP
CT Probe RNA: NEGATIVE
GC Probe RNA: NEGATIVE

## 2014-10-12 ENCOUNTER — Inpatient Hospital Stay (HOSPITAL_COMMUNITY)
Admission: AD | Admit: 2014-10-12 | Discharge: 2014-10-12 | Disposition: A | Discharge status: Home / Self Care | Payer: Medicaid Other | Source: Ambulatory Visit | Attending: Obstetrics & Gynecology | Admitting: Obstetrics & Gynecology

## 2014-10-12 ENCOUNTER — Encounter (HOSPITAL_COMMUNITY): Payer: Self-pay

## 2014-10-12 DIAGNOSIS — O468X1 Other antepartum hemorrhage, first trimester: Secondary | ICD-10-CM

## 2014-10-12 DIAGNOSIS — O209 Hemorrhage in early pregnancy, unspecified: Secondary | ICD-10-CM | POA: Diagnosis present

## 2014-10-12 DIAGNOSIS — O418X10 Other specified disorders of amniotic fluid and membranes, first trimester, not applicable or unspecified: Secondary | ICD-10-CM

## 2014-10-12 DIAGNOSIS — Z3A12 12 weeks gestation of pregnancy: Secondary | ICD-10-CM | POA: Diagnosis not present

## 2014-10-12 DIAGNOSIS — O4691 Antepartum hemorrhage, unspecified, first trimester: Secondary | ICD-10-CM

## 2014-10-12 DIAGNOSIS — O208 Other hemorrhage in early pregnancy: Secondary | ICD-10-CM | POA: Diagnosis not present

## 2014-10-12 HISTORY — DX: Unspecified asthma, uncomplicated: J45.909

## 2014-10-12 LAB — URINALYSIS, ROUTINE W REFLEX MICROSCOPIC
BILIRUBIN URINE: NEGATIVE
Glucose, UA: NEGATIVE mg/dL
KETONES UR: NEGATIVE mg/dL
Leukocytes, UA: NEGATIVE
Nitrite: NEGATIVE
PH: 6 (ref 5.0–8.0)
Protein, ur: NEGATIVE mg/dL
Specific Gravity, Urine: 1.025 (ref 1.005–1.030)
Urobilinogen, UA: 1 mg/dL (ref 0.0–1.0)

## 2014-10-12 LAB — URINE MICROSCOPIC-ADD ON

## 2014-10-12 NOTE — MAU Provider Note (Signed)
History     CSN: [id number]  Arrival date and time: 10/12/14 1128   None     Chief Complaint  Patient presents with  . Vaginal Bleeding  . Back Pain   HPI  [name] is a 28 y.o. female [phone] at 503w4d who presents for recurring vaginal bleeding. She first started bleeding in September and [facility]; she found out at that time she was having twins and that she had a moderate to large subchorionic hemorrhage on US.  Denies abdominal pain. Bleeding in the same as it has been without worsening.   OB History   Grav Para Term Preterm Abortions TAB SAB Ect Mult Living   6 2 2  3 3    2       Past Medical History  Diagnosis Date  . Asthma     History reviewed. No pertinent past surgical history.  History reviewed. No pertinent family history.  History  Substance Use Topics  . Smoking status: Never Smoker   . Smokeless tobacco: Not on file  . Alcohol Use: No    Allergies:  Allergies  Allergen Reactions  . Shrimp [Shellfish Allergy] Shortness Of Breath and Itching    Prescriptions prior to admission  Medication Sig Dispense Refill  . Prenatal Vit-Fe Fumarate-FA (PRENATAL VITAMINS) 28-0.8 MG TABS Take 1 capsule by mouth daily.  30 tablet  0  . simethicone (MYLICON) 125 MG chewable tablet Chew 125 mg by mouth every 6 (six) hours as needed for flatulence.      . Fluticasone-Salmeterol (ADVAIR) 250-50 MCG/DOSE AEPB Inhale 2 puffs into the lungs daily as needed (wheezing).       Results for orders placed during the hospital encounter of 10/12/14 (from the past 48 hour(s))  URINALYSIS, ROUTINE W REFLEX MICROSCOPIC     Status: Abnormal   Collection Time    10/12/14 12:22 PM      Result Value Ref Range   Color, Urine YELLOW  YELLOW   APPearance CLEAR  CLEAR   Specific Gravity, Urine 1.025  1.005 - 1.030   pH 6.0  5.0 - 8.0   Glucose, UA NEGATIVE  NEGATIVE mg/dL   Hgb urine dipstick LARGE (*) NEGATIVE   Bilirubin Urine NEGATIVE  NEGATIVE   Ketones, ur NEGATIVE  NEGATIVE mg/dL   Protein, ur NEGATIVE  NEGATIVE mg/dL   Urobilinogen, UA 1.0  0.0 - 1.0 mg/dL   Nitrite NEGATIVE  NEGATIVE   Leukocytes, UA NEGATIVE  NEGATIVE  URINE MICROSCOPIC-ADD ON     Status: Abnormal   Collection Time    10/12/14 12:22 PM      Result Value Ref Range   Squamous Epithelial / LPF MANY (*) RARE   WBC, UA 0-2  <3 WBC/hpf   RBC / HPF 3-6  <3 RBC/hpf   Urine-Other MUCOUS PRESENT      CLINICAL DATA: Pelvic pain and vaginal bleeding. Estimated  gestational age by LMP is 6 weeks 2 days. Quantitative beta HCG is  120,366.  EXAM:  TWIN OBSTETRIC <14WK US AND TRANSVAGINAL OB US  COMPARISON: None.  FINDINGS:  Twin intrauterine gestational sacs are present.  TWIN 1  Intrauterine gestational sac: Intrauterine gestational sac located  anteriorly and slightly smaller to twin 2.  Yolk sac: Present  Embryo: Present  Cardiac Activity: Present  Heart Rate: 132 bpm  CRL: 9.7 mm 7 w 0 d US EDC: 04/22/2015  TWIN 2  Intrauterine gestational sac: Intrauterine gestational sac located  posteriorly  and slightly larger with respect to twin 1.  Yolk sac: Present  Embryo: Present  Cardiac Activity: Present  Heart Rate: 130 bpm  CRL: 9.4 mm 7 w 0 d US EDC: 04/22/2015  Maternal uterus/adnexae: The uterus is anteverted. It a small to  moderate-sized subchorionic hemorrhage is demonstrated inferiorly  and extending around gestational sac 2. Both ovaries are visualized.  The right ovary measures 4.6 x 2.5 x 2.5 cm. Left ovary measures 3.8  x 2 x 1.2 cm. No abnormal adnexal masses. No free pelvic fluid  collections.  IMPRESSION:  Twin intrauterine pregnancy. Two fetal poles are demonstrated, each  suggesting 7 weeks 0 days estimated gestational age by crown-rump  length. A moderate size subchorionic hemorrhage is present.  Electronically Signed  By: Burman NievesWilliam Stevens M.D.  On: 09/03/2014 06:25  Review of Systems  Constitutional: Negative for fever and chills.   Gastrointestinal: Negative for nausea, vomiting, abdominal pain, diarrhea and constipation.  Genitourinary: Negative for dysuria, urgency, frequency and hematuria.       No vaginal discharge. + vaginal bleeding; notices it only when she wipes. The color is brown.  No dysuria.    Physical Exam   Blood pressure 121/62, pulse 101, temperature 98.7 F (37.1 C), temperature source Oral, resp. rate 16, height 5\' 4"  (1.626 m), weight 65.499 kg (144 lb 6.4 oz), last menstrual period 07/21/2014, SpO2 100.00%.  Physical Exam  Constitutional: She is oriented to person, place, and time. She appears well-developed and well-nourished. No distress.  HENT:  Head: Normocephalic.  Eyes: Pupils are equal, round, and reactive to light.  Neck: Neck supple.  Respiratory: Effort normal.  GI: Soft. She exhibits no distension. There is no tenderness.  Genitourinary:  Speculum exam: Vagina - Scant amount of creamy, brown discharge, no odor Cervix - No contact bleeding, no active bleeding  Bimanual exam: Cervix closed Uterus non tender, enlarged  Adnexa non tender, no masses bilaterally Chaperone present for exam.   Musculoskeletal: Normal range of motion.  Neurological: She is alert and oriented to person, place, and time.  Skin: Skin is warm. She is not diaphoretic.  Psychiatric: Her behavior is normal.    MAU Course  Procedures None  MDM + fht  B positive blood type   Assessment and Plan   A: 1. Vaginal bleeding in pregnancy, first trimester   2. Subchorionic hematoma in first trimester    P: Discharge home in stable condition Keep your [facility] Return to MAU with worsening symptoms Pelvic rest Bleeding precautions    Iona HansenJennifer Irene Rasch, NP 10/12/2014 4:58 PM

## 2014-10-12 NOTE — MAU Note (Signed)
Patient states she has had some spotting off and on, more for the past couple of days. Started having low back pain yesterday.

## 2014-10-12 NOTE — Discharge Instructions (Signed)
Vaginal Bleeding During Pregnancy, First Trimester °A small amount of bleeding (spotting) from the vagina is relatively common in early pregnancy. It usually stops on its own. Various things may cause bleeding or spotting in early pregnancy. Some bleeding may be related to the pregnancy, and some may not. In most cases, the bleeding is normal and is not a problem. However, bleeding can also be a sign of something serious. Be sure to tell your health care provider about any vaginal bleeding right away. °Some possible causes of vaginal bleeding during the first trimester include: °· Infection or inflammation of the cervix. °· Growths (polyps) on the cervix. °· Miscarriage or threatened miscarriage. °· Pregnancy tissue has developed outside of the uterus and in a fallopian tube (tubal pregnancy). °· Tiny cysts have developed in the uterus instead of pregnancy tissue (molar pregnancy). °HOME CARE INSTRUCTIONS  °Watch your condition for any changes. The following actions may help to lessen any discomfort you are feeling: °· Follow your health care provider's instructions for limiting your activity. If your health care provider orders bed rest, you may need to stay in bed and only get up to use the bathroom. However, your health care provider may allow you to continue light activity. °· If needed, make plans for someone to help with your regular activities and responsibilities while you are on bed rest. °· Keep track of the number of pads you use each day, how often you change pads, and how soaked (saturated) they are. Write this down. °· Do not use tampons. Do not douche. °· Do not have sexual intercourse or orgasms until approved by your health care provider. °· If you pass any tissue from your vagina, save the tissue so you can show it to your health care provider. °· Only take over-the-counter or prescription medicines as directed by your health care provider. °· Do not take aspirin because it can make you  bleed. °· Keep all follow-up appointments as directed by your health care provider. °SEEK MEDICAL CARE IF: °· You have any vaginal bleeding during any part of your pregnancy. °· You have cramps or labor pains. °· You have a fever, not controlled by medicine. °SEEK IMMEDIATE MEDICAL CARE IF:  °· You have severe cramps in your back or belly (abdomen). °· You pass large clots or tissue from your vagina. °· Your bleeding increases. °· You feel light-headed or weak, or you have fainting episodes. °· You have chills. °· You are leaking fluid or have a gush of fluid from your vagina. °· You pass out while having a bowel movement. °MAKE SURE YOU: °· Understand these instructions. °· Will watch your condition. °· Will get help right away if you are not doing well or get worse. °Document Released: 09/25/2005 Document Revised: 12/21/2013 Document Reviewed: 08/23/2013 °ExitCare® Patient Information ©2015 [employer]. This information is not intended to replace advice given to you by your health care provider. Make sure you discuss any questions you have with your health care provider. ° °Pelvic Rest °Pelvic rest is sometimes recommended for women when:  °· The placenta is partially or completely covering the opening of the cervix (placenta previa). °· There is bleeding between the uterine wall and the amniotic sac in the first trimester (subchorionic hemorrhage). °· The cervix begins to open without labor starting (incompetent cervix, cervical insufficiency). °· The labor is too early (preterm labor). °HOME CARE INSTRUCTIONS °· Do not have sexual intercourse, stimulation, or an orgasm. °· Do not use tampons, douche, or   put anything in the vagina.  Do not lift anything over 10 pounds (4.5 kg).  Avoid strenuous activity or straining your pelvic muscles. SEEK MEDICAL CARE IF:  You have any vaginal bleeding during pregnancy. Treat this as a potential emergency.  You have cramping pain felt low in the stomach (stronger  than menstrual cramps).  You notice vaginal discharge (watery, mucus, or bloody).  You have a low, dull backache.  There are regular contractions or uterine tightening. SEEK IMMEDIATE MEDICAL CARE IF: You have vaginal bleeding and have placenta previa.  Document Released: 04/12/2011 Document Revised: 03/09/2012 Document Reviewed: 04/12/2011 [facility] Patient Information 2015 [place], [place]. This information is not intended to replace advice given to you by your health care provider. Make sure you discuss any questions you have with your health care provider.  Subchorionic Hematoma A subchorionic hematoma is a gathering of blood between the outer wall of the placenta and the inner wall of the womb (uterus). The placenta is the organ that connects the fetus to the wall of the uterus. The placenta performs the feeding, breathing (oxygen to the fetus), and waste removal (excretory work) of the fetus.  Subchorionic hematoma is the most common abnormality found on a result from ultrasonography done during the first trimester or early second trimester of pregnancy. If there has been little or no vaginal bleeding, early small hematomas usually shrink on their own and do not affect your baby or pregnancy. The blood is gradually absorbed over 1-2 weeks. When bleeding starts later in pregnancy or the hematoma is larger or occurs in an older pregnant woman, the outcome may not be as good. Larger hematomas may get bigger, which increases the chances for miscarriage. Subchorionic hematoma also increases the risk of premature detachment of the placenta from the uterus, preterm (premature) labor, and stillbirth. HOME CARE INSTRUCTIONS  Stay on bed rest if your health care provider recommends this. Although bed rest will not prevent more bleeding or prevent a miscarriage, your health care provider may recommend bed rest until you are advised otherwise.  Avoid heavy lifting (more than 10 lb [4.5 kg]), exercise,  sexual intercourse, or douching as directed by your health care provider.  Keep track of the number of pads you use each day and how soaked (saturated) they are. Write down this information.  Do not use tampons.  Keep all follow-up appointments as directed by your health care provider. Your health care provider may ask you to have follow-up blood tests or ultrasound tests or both. SEEK IMMEDIATE MEDICAL CARE IF:  You have severe cramps in your stomach, back, abdomen, or pelvis.  You have a fever.  You pass large clots or tissue. Save any tissue for your health care provider to look at.  Your bleeding increases or you become lightheaded, feel weak, or have fainting episodes. Document Released: 04/02/2007 Document Revised: 05/02/2014 Document Reviewed: 07/15/2013 [facility] Patient Information 2015 [place], [place]. This information is not intended to replace advice given to you by your health care provider. Make sure you discuss any questions you have with your health care provider.

## 2014-10-14 ENCOUNTER — Other Ambulatory Visit: Payer: Self-pay

## 2014-10-14 LAB — OB RESULTS CONSOLE ABO/RH: RH Type: POSITIVE

## 2014-10-14 LAB — OB RESULTS CONSOLE ANTIBODY SCREEN: Antibody Screen: NEGATIVE

## 2014-10-14 LAB — OB RESULTS CONSOLE HIV ANTIBODY (ROUTINE TESTING): HIV: NONREACTIVE

## 2014-10-14 LAB — OB RESULTS CONSOLE HEPATITIS B SURFACE ANTIGEN: HEP B S AG: NEGATIVE

## 2014-10-14 LAB — OB RESULTS CONSOLE RPR: RPR: NONREACTIVE

## 2014-10-15 NOTE — MAU Provider Note (Signed)

## 2014-10-17 LAB — CYTOLOGY - PAP

## 2014-10-18 LAB — HM PAP SMEAR

## 2014-10-31 ENCOUNTER — Encounter (HOSPITAL_COMMUNITY): Payer: Self-pay

## 2014-12-30 NOTE — L&D Delivery Note (Cosign Needed)
Delivery Note   Guagliardo, Boy Delaney Meigsamara [[id number]][[id number]]  At 2:25 PM a viable female was delivered via  (Presentation:vertex ;  ).  APGAR:8 ,9 ; weight  .   Placenta status:spont ,via shultz .  Cord:3vc  with the following complications:delivered in commode .  Anesthesia: none  Episiotomy:  none Lacerations: none  Suture Repair: n/a Est. Blood Loss 250(mL):      [address]Anwar, Dimas ChyleGirlB Elexa [[id number]][[id number]]  At  a viable female was delivered via  (Presentation:vertex ; LOA ).  APGAR: , ; weight  .   Placenta status:spont ,shultz .  Cord: 3vc with the following complications: .  Anesthesia:  none Episiotomy:  none Lacerations: none  Suture Repair: n/a Est. Blood Loss (mL):     Mom to postpartum.   Baby A to NICU.   Baby B to NICU.  Wyvonnia DuskyLAWSON, Deven Audi DARLENE 03/12/2015, 2:53 PM

## 2015-02-22 ENCOUNTER — Encounter (HOSPITAL_COMMUNITY): Payer: Self-pay | Admitting: General Practice

## 2015-02-22 ENCOUNTER — Inpatient Hospital Stay (HOSPITAL_COMMUNITY)
Admission: AD | Admit: 2015-02-22 | Discharge: 2015-02-27 | DRG: 778 | Disposition: A | Discharge status: Home / Self Care | Payer: Medicaid Other | Source: Ambulatory Visit | Attending: Obstetrics | Admitting: Obstetrics

## 2015-02-22 DIAGNOSIS — O322XX2 Maternal care for transverse and oblique lie, fetus 2: Secondary | ICD-10-CM | POA: Diagnosis present

## 2015-02-22 DIAGNOSIS — O30043 Twin pregnancy, dichorionic/diamniotic, third trimester: Secondary | ICD-10-CM | POA: Diagnosis present

## 2015-02-22 DIAGNOSIS — O30049 Twin pregnancy, dichorionic/diamniotic, unspecified trimester: Secondary | ICD-10-CM | POA: Insufficient documentation

## 2015-02-22 DIAGNOSIS — Z3A31 31 weeks gestation of pregnancy: Secondary | ICD-10-CM

## 2015-02-22 LAB — TYPE AND SCREEN
ABO/RH(D): B POS
Antibody Screen: NEGATIVE

## 2015-02-22 LAB — CBC WITH DIFFERENTIAL/PLATELET
BASOS ABS: 0 10*3/uL (ref 0.0–0.1)
Basophils Relative: 0 % (ref 0–1)
EOS ABS: 0 10*3/uL (ref 0.0–0.7)
EOS PCT: 0 % (ref 0–5)
HCT: 33.8 % — ABNORMAL LOW (ref 36.0–46.0)
Hemoglobin: 11.7 g/dL — ABNORMAL LOW (ref 12.0–15.0)
Lymphocytes Relative: 28 % (ref 12–46)
Lymphs Abs: 1.8 10*3/uL (ref 0.7–4.0)
MCH: 32.1 pg (ref 26.0–34.0)
MCHC: 34.6 g/dL (ref 30.0–36.0)
MCV: 92.9 fL (ref 78.0–100.0)
Monocytes Absolute: 0.5 10*3/uL (ref 0.1–1.0)
Monocytes Relative: 8 % (ref 3–12)
Neutro Abs: 4 10*3/uL (ref 1.7–7.7)
Neutrophils Relative %: 64 % (ref 43–77)
PLATELETS: 188 10*3/uL (ref 150–400)
RBC: 3.64 MIL/uL — AB (ref 3.87–5.11)
RDW: 12.1 % (ref 11.5–15.5)
WBC: 6.4 10*3/uL (ref 4.0–10.5)

## 2015-02-22 MED ORDER — LACTATED RINGERS IV BOLUS (SEPSIS): 1000.0000 mL | Freq: Once | INTRAVENOUS | Status: DC

## 2015-02-22 MED ORDER — LACTATED RINGERS IV SOLN
INTRAVENOUS | Status: DC
  Administered 2015-02-22: 100 mL/h via INTRAVENOUS
  Administered 2015-02-23 – 2015-02-26 (×8): via INTRAVENOUS

## 2015-02-22 MED ORDER — PENICILLIN G POTASSIUM 5000000 UNITS IJ SOLR
2.5000 10*6.[IU] | INTRAVENOUS | Status: DC
  Administered 2015-02-23 – 2015-02-25 (×15): 2.5 10*6.[IU] via INTRAVENOUS
  Filled 2015-02-22 (×17): qty 2.5

## 2015-02-22 MED ORDER — DEXTROSE 5 % IV SOLN
5.0000 10*6.[IU] | Freq: Once | INTRAVENOUS | Status: AC
  Administered 2015-02-22: 5 10*6.[IU] via INTRAVENOUS
  Filled 2015-02-22: qty 5

## 2015-02-22 MED ORDER — BETAMETHASONE SOD PHOS & ACET 6 (3-3) MG/ML IJ SUSP
12.0000 mg | INTRAMUSCULAR | Status: AC
  Administered 2015-02-22 – 2015-02-23 (×2): 12 mg via INTRAMUSCULAR
  Filled 2015-02-22 (×2): qty 2

## 2015-02-22 MED ORDER — ACETAMINOPHEN 325 MG PO TABS: 650.0000 mg | ORAL_TABLET | ORAL | Status: DC | PRN

## 2015-02-22 MED ORDER — LACTATED RINGERS IV SOLN: 500.0000 mL | INTRAVENOUS | Status: DC | PRN

## 2015-02-22 MED ORDER — MAGNESIUM SULFATE 40 G IN LACTATED RINGERS - SIMPLE
2.0000 g/h | INTRAVENOUS | Status: DC
  Filled 2015-02-22: qty 500

## 2015-02-22 MED ORDER — ONDANSETRON HCL 4 MG/2ML IJ SOLN: 4.0000 mg | Freq: Four times a day (QID) | INTRAMUSCULAR | Status: DC | PRN

## 2015-02-22 MED ORDER — MAGNESIUM SULFATE BOLUS VIA INFUSION
4.0000 g | Freq: Once | INTRAVENOUS | Status: AC
  Administered 2015-02-22: 4 g via INTRAVENOUS
  Filled 2015-02-22: qty 500

## 2015-02-22 MED ORDER — BUTORPHANOL TARTRATE 1 MG/ML IJ SOLN
1.0000 mg | INTRAMUSCULAR | Status: DC | PRN
  Filled 2015-02-22: qty 1

## 2015-02-22 NOTE — MAU Provider Note (Signed)
  History     CSN: [id number]  Arrival date and time: 02/22/15 1921   None     Chief Complaint  Patient presents with  . Labor Eval   HPI  28 y.J.[id number]$o.G6P2032@[redacted]w[redacted]d  with twin gestation presents to the MAU having painful contractions all day. Denies LOF, Vaginal bleeding, Reports positive fetaL movement.     Past Medical History  Diagnosis Date  . Asthma     History reviewed. No pertinent past surgical history.  History reviewed. No pertinent family history.  History  Substance Use Topics  . Smoking status: Never Smoker   . Smokeless tobacco: Not on file  . Alcohol Use: No    Allergies:  Allergies  Allergen Reactions  . Shrimp [Shellfish Allergy] Shortness Of Breath and Itching    Prescriptions prior to admission  Medication Sig Dispense Refill Last Dose  . Fluticasone-Salmeterol (ADVAIR) 250-50 MCG/DOSE AEPB Inhale 2 puffs into the lungs daily as needed (wheezing).   unknown  . Prenatal Vit-Fe Fumarate-FA (PRENATAL VITAMINS) 28-0.8 MG TABS Take 1 capsule by mouth daily. 30 tablet 0 Past Week at Unknown time  . simethicone (MYLICON) 125 MG chewable tablet Chew 125 mg by mouth every 6 (six) hours as needed for flatulence.   Past Week at Unknown time    Review of Systems  Constitutional: Negative for fever and chills.  Eyes: Negative for blurred vision.  Respiratory: Negative for cough.   Cardiovascular: Negative for chest pain.  Gastrointestinal: Positive for abdominal pain. Negative for heartburn, nausea and vomiting.  Genitourinary: Negative for dysuria, urgency and frequency.  Musculoskeletal: Negative.   Skin: Negative for itching and rash.  Neurological: Negative for dizziness, tingling and headaches.  Endo/Heme/Allergies: Negative.   Psychiatric/Behavioral: Negative.    Physical Exam   Blood pressure 128/87, pulse 89, temperature 98.3 F (36.8 C), temperature source Oral, resp. rate 18, last menstrual period 07/21/2014.  Physical Exam  Nursing note  and vitals reviewed. Constitutional: She is oriented to person, place, and time. She appears well-developed and well-nourished.  HENT:  Head: Normocephalic and atraumatic.  Neck: Normal range of motion.  Cardiovascular: Normal rate.   Respiratory: Effort normal. No respiratory distress.  GI: Soft. She exhibits no distension and no mass. There is no tenderness. There is no rebound and no guarding.  Genitourinary: Vagina normal.  Musculoskeletal: Normal range of motion.  Neurological: She is alert and oriented to person, place, and time.  Skin: Skin is warm and dry.  Psychiatric: She has a normal mood and affect. Her behavior is normal. Judgment and thought content normal.    MAU Course  Procedures   Dilation: 2 Effacement (%): 70 Station: -3 Presentation: Vertex Exam by:: Illene BolusLori Kayden Amend, CNM Consulted  With Dr Chestine Sporelark and orders received for Admission; Mag Sulfate 4 gm load; Betamethasone. Consulted with Dr Emelda FearFerguson for position . Baby A- Vertex; Baby B transverse.  Assessment and Plan  IUP @ 31+4 Twin Gestation Preterm Labor Baby A -Cat 1 Baby B- Cat1 uc's q1-2  Admit to L/D   Tonya [name] 02/22/2015, 7:46 PM

## 2015-02-22 NOTE — H&P (Signed)
29 y.o. [id number] @ [redacted]w[redacted]d with di-di twin pregnancy presents with painful contractions since this AM.  She was noted to be contracting painfully q2 min in MAU. Since IVF/magnesium started, she contractions are spacing and less painful.  Otherwise has good fetal movement and no bleeding.  No n/v/d/c.  No h/o PTL.  No s/sx UTI.    Past Medical History  Diagnosis Date  . Asthma    History reviewed. No pertinent past surgical history.  OB History  Gravida Para Term Preterm AB SAB TAB Ectopic Multiple Living  # Outcome Date GA Lbr Len/2nd Weight Sex Delivery Anes PTL Lv  6 Current           5 TAB           4 TAB           3 TAB           2 Term           1 Term               TSVD x 2, 7lb 6oz, 8lb 3oz in 2003, 2010  History   Social History  . Marital Status: Married    Spouse Name: N/A  . Number of Children: N/A  . Years of Education: N/A   Occupational History  . Not on file.   Social History Main Topics  . Smoking status: Never Smoker   . Smokeless tobacco: Not on file  . Alcohol Use: No  . Drug Use: No  . Sexual Activity: Not on file   Other Topics Concern  . Not on file   Social History Narrative   Shrimp and Latex    Prenatal Transfer Tool  Maternal Diabetes: No Genetic Screening: Declined Maternal Ultrasounds/Referrals: Normal Fetal Ultrasounds or other Referrals:  None Maternal Substance Abuse:  No Significant Maternal Medications:  None Significant Maternal Lab Results: None    ABO, Rh: B/Positive/-- (10/16 0000) Antibody: Negative (10/16 0000) Rubella:  Immune RPR: Nonreactive (10/16 0000)  HBsAg: Negative (10/16 0000)  HIV: Non-reactive (10/16 0000)  GBS:   unknown   Other PNC: uncomplicated.    Filed Vitals:   02/22/15 2106  BP:   Pulse: 79  Temp:   Resp:      General:  NAD, now comfortable with contractions Abdomen:  soft, gravid Ex:  no edema SVE:  2/90/mid/soft/-3, vtx FHTs: A 130s, mod var, + accels, Cat 1; B  140s, mod var + accels, Cat 1 Toco:  q2 min on admission, now spaced to q3-14min  BSUS in MAU: A vtx B transverse  A/P   29 y.o. H4V4259 @ [redacted]w[redacted]d with di-di twin pregnancy presents with threatened preterm labor 1. TPTL:  Uncomfortable with contractions on admission.  Given GA<34 weeks, will admit, start IVF, magnesium for neuroprotection as well as tocolysis.  PCN for GBS unknown.  GBS swab collected on admit.   Consider adding procardia if magnesium not sufficient.  No s/sx intrauterine infection at this time.    2. Di-di twin pregnancy: last growth on 01/30/15 was concordant, baby A 1227gm (2#11) (53%) B 1242gm (2#12) (54%).  On BSUS today, positions vtx/trv.  If contractions space, will get official growth scan.  Briefly discussed mode of delivery.  Reviewed that options include primary cesarean delivery vs vaginal delivery.  Reviewed the potential risks of vaginal delivery, possible need for breech extraction of baby B, possible need  for emergent cesarean delivery of baby B.   Discussed that most guidelines do not recommend breech extraction for B if <1500gm.  Reviewed risks of breech extraction to include head entrapment.  She desires trial of vaginal delivery. Will need a more accurate EFW to discuss breech extraction in more detail.  At this time, she desires vaginal delivery.  3. Prematurity: Administer BMZ course now for fetal lung maturity.  Magnesium for NP as <32 weeks.  Will defer NICU consult until AM unless cervical exam progresses.  4. FSR/ vtx/ GBS unknown on PCN.   SundownLARK, [facility], IncDYANNA

## 2015-02-23 ENCOUNTER — Inpatient Hospital Stay (HOSPITAL_COMMUNITY): Payer: Medicaid Other

## 2015-02-23 DIAGNOSIS — Z3A31 31 weeks gestation of pregnancy: Secondary | ICD-10-CM | POA: Insufficient documentation

## 2015-02-23 DIAGNOSIS — O30049 Twin pregnancy, dichorionic/diamniotic, unspecified trimester: Secondary | ICD-10-CM | POA: Insufficient documentation

## 2015-02-23 LAB — URINE CULTURE
Colony Count: 30000
SPECIAL REQUESTS: NORMAL

## 2015-02-23 LAB — ABO/RH: ABO/RH(D): B POS

## 2015-02-23 MED ORDER — DOCUSATE SODIUM 100 MG PO CAPS
100.0000 mg | ORAL_CAPSULE | Freq: Two times a day (BID) | ORAL | Status: DC
  Administered 2015-02-23 – 2015-02-26 (×8): 100 mg via ORAL
  Filled 2015-02-23 (×9): qty 1

## 2015-02-23 MED ORDER — FAMOTIDINE 20 MG PO TABS
20.0000 mg | ORAL_TABLET | Freq: Every day | ORAL | Status: DC
  Administered 2015-02-23 – 2015-02-26 (×4): 20 mg via ORAL
  Filled 2015-02-23 (×5): qty 1

## 2015-02-23 MED ORDER — COMPLETENATE 29-1 MG PO CHEW
1.0000 | CHEWABLE_TABLET | Freq: Every day | ORAL | Status: DC
  Filled 2015-02-23: qty 1

## 2015-02-23 MED ORDER — CALCIUM CARBONATE ANTACID 500 MG PO CHEW
400.0000 mg | CHEWABLE_TABLET | Freq: Four times a day (QID) | ORAL | Status: DC | PRN
  Administered 2015-02-23 – 2015-02-26 (×5): 400 mg via ORAL
  Filled 2015-02-23 (×2): qty 1
  Filled 2015-02-23: qty 2
  Filled 2015-02-23 (×6): qty 1

## 2015-02-23 MED ORDER — NIFEDIPINE 10 MG PO CAPS
10.0000 mg | ORAL_CAPSULE | Freq: Four times a day (QID) | ORAL | Status: DC
  Administered 2015-02-23 – 2015-02-24 (×4): 10 mg via ORAL
  Filled 2015-02-23 (×4): qty 1

## 2015-02-23 MED ORDER — COMPLETENATE 29-1 MG PO CHEW
1.0000 | CHEWABLE_TABLET | Freq: Every day | ORAL | Status: DC
  Administered 2015-02-23 – 2015-02-26 (×4): 1 via ORAL
  Filled 2015-02-23 (×4): qty 1

## 2015-02-23 NOTE — Progress Notes (Signed)
Spoke with Dr Vincenza HewsQuinn about pt cont to contract and she states that it is appropriate for the pt to have Procardia through 24 hours after her 2nd dose of BMZ if her cervix in unchanged. Restart Mag is cervix is changing.

## 2015-02-23 NOTE — Progress Notes (Signed)
Contractions have spaced significantly.  Pt now asleep  Toco: q10-20 min EFM: 120s mod var, cat 1 x 2  [id number] @ 5129w4d w TPTL, di-di twins.  Will transfer to antepartum, d/c magnesium in AM.  Once daily fetal monitoring.  Plan to complete betamethasone series tomorrow evening. Plan growth scan in AM Will need NICU consult if delivery appears imminent.

## 2015-02-23 NOTE — Progress Notes (Signed)
Pt is now off of MgSO4. She had an increase in ctxs and was given po Procardia. Her ctxs stopped. Her cx has not changed. Seh will get an u/s today. Now on PCN

## 2015-02-23 NOTE — Progress Notes (Signed)
Pt to room 155 and then called for MFM u/s and taken via w/c for u/s.

## 2015-02-24 LAB — CULTURE, BETA STREP (GROUP B ONLY)

## 2015-02-24 MED ORDER — NIFEDIPINE 10 MG PO CAPS
10.0000 mg | ORAL_CAPSULE | ORAL | Status: DC
  Administered 2015-02-24 (×2): 10 mg via ORAL
  Filled 2015-02-24 (×4): qty 1

## 2015-02-24 MED ORDER — NIFEDIPINE 10 MG PO CAPS
10.0000 mg | ORAL_CAPSULE | Freq: Four times a day (QID) | ORAL | Status: DC
  Administered 2015-02-24 – 2015-02-27 (×9): 10 mg via ORAL
  Filled 2015-02-24 (×10): qty 1

## 2015-02-24 NOTE — Progress Notes (Signed)
Late decel of baby B with a five minute duration; nadir of 60 for one minute with gradual return to baseline following a five minute contraction. Dr. Henderson CloudHorvath notified of decel, she request patient remain on the monitor for an hour for further observation.

## 2015-02-24 NOTE — Progress Notes (Signed)
29 y.o. [id number] 7653w6d HD#2 admitted for Twins with preterm labor.  Pt currently stable with contractions every 5-10 minutes today.  Good FM.  Filed Vitals:   02/23/15 1245 02/23/15 1604 02/23/15 1829 02/23/15 2216  BP: 95/50 107/63 119/76 112/58  Pulse: 82 76 64 69  Temp: 98.9 F (37.2 C) 98.3 F (36.8 C)    TempSrc: Oral Oral    Resp: 20 20    Height:      Weight:      SpO2:        Lungs CTA Cor RRR Abd  Soft, gravid, nontender Ex SCDs FHTs  Last night A130s B 120s, both good short term variability, NST R Toco  Irritability with occ CTX  No results found for this or any previous visit (from the past 24 hour(s)).  A:  HD#2  8853w6d with PTL with twins.  P: 1.  Betamethasone complete 2-25. 2.  US 2-25: A 1907 VTX; B transverse left 2005; 5% discord; good fluid.  Pt counseled about C/S for transverse presentation of B and avoiding vaginal birth with A and C/S for B- pt to decide.   3.  Labs normal, GBS P- on PCN in meantime. 4.  Pt currently on procardia- will d/c if stable for 24 hours.  S/p MgSO4 for neuroprotection.  Gabrianna Fassnacht A

## 2015-02-24 NOTE — Progress Notes (Signed)
MD informed that pt refusing her procardia and wanting to ambulate and have the babies if she starts to labor again. MD to come and discuss with pt POC and NICU consult put in

## 2015-02-25 LAB — TYPE AND SCREEN
ABO/RH(D): B POS
ANTIBODY SCREEN: NEGATIVE

## 2015-02-25 NOTE — Consult Note (Signed)
Neonatology Consult to Antenatal Patient:  I was asked by Dr. Henderson CloudHorvath to see this patient in order to provide antenatal counseling due to onset of preterm labor at 32 weeks, twin gestation.  Tonya [name] was admitted on 2/24 and is now 732 0/[redacted] weeks GA. She is currently having occasional contractions. She got BMZ on 2/24-25 and also received neuroprotective Magnesium sulfate. She got a 3-day course of Pen G due to unknown GBS status and is on Procardia for tocolysis. The twins are Di-Di, one female and one female infant, and will be the couple's third and fourth children.  I spoke with the patient alone. We discussed the worst case of delivery in the next 1-2 days, including usual DR management, possible respiratory complications and need for support, IV access, feedings (mother desires formula feeding), LOS, Mortality and Morbidity, and long term outcomes. She did not have any questions at this time. I offered a NICU tour to any interested family members and would be glad to come back if she has more questions later.  Thank you for asking me to see this patient.  Tonya Souhristie C. Storm Dulski, MD Neonatologist  The total length of face-to-face or floor/unit time for this encounter was 20 minutes. Counseling and/or coordination of care was 15 minutes of the above.

## 2015-02-25 NOTE — Progress Notes (Signed)
29 y.o. [id number] 2870w0d HD#3 admitted for Twins and preterm labor.  Pt currently stable with no c/o except some contractions.  Good FM.  Filed Vitals:   02/24/15 1729 02/24/15 1730 02/24/15 2001 02/25/15 0856  BP:  109/62 114/67 115/64  Pulse:  75 61 66  Temp: 98.7 F (37.1 C)  98.7 F (37.1 C) 98.4 F (36.9 C)  TempSrc: Oral  Oral Oral  Resp: 16  18 20   Height:      Weight:      SpO2:        Lungs CTA Cor RRR Abd  Soft, gravid, nontender Ex SCDs FHTs  Last night A 130s, B 120s, good short term variability, NST R; B had a prolonged decel for 5 minutes to 60-70s at 1800 but resolved spontaneously and FHTs were Cat 1 with NST R for an hour after that.    This am  A 120s, B 130s, No decels, both g STV and NST R.   Toco  q 10.    A:  HD#3  1470w0d with Twins and PTL.  P: 1. Betamethasone complete 2-25. 2. US 2-25: A 1907 VTX; B transverse left 2005; 5% discord; good fluid. Pt counseled about C/S for transverse presentation of B and avoiding vaginal birth with A and C/S for B- pt to decide.  3. Labs normal, GBS P- on PCN in meantime. 4. Pt currently on procardia- will d/c if stable for 24 hours. S/p MgSO4 for neuroprotection.  I d/w pt yesterday that I use procardia to make her more comfortable by stopping contractions that are not labor.  We discussed that it was reasonable for her not to take it but if her contractions were decreased, she would be in less pain and we could avoid vaginal checks to see if she was in labor.  She was ok for taking q 6 hours.  Pt's contractions are decreased now; may skip next dose if pt desires.     Jaelynn Currier A

## 2015-02-25 NOTE — Progress Notes (Signed)
Pt encouraged to void secondary ctxs

## 2015-02-26 MED ORDER — NIFEDIPINE 10 MG PO CAPS
10.0000 mg | ORAL_CAPSULE | Freq: Once | ORAL | Status: AC
  Administered 2015-02-26: 10 mg via ORAL

## 2015-02-26 MED ORDER — SODIUM CHLORIDE 0.9 % IJ SOLN
3.0000 mL | Freq: Two times a day (BID) | INTRAMUSCULAR | Status: DC
  Administered 2015-02-26: 3 mL via INTRAVENOUS

## 2015-02-26 NOTE — Progress Notes (Addendum)
29 y.o. [id number] 283w1d HD#4 admitted for preterm labor and twins.  Pt currently stable with no c/o back pain and pressure now- but still having contractions every 5 minutes.  Pt had some painful contractions that started this am at 0800 with back pain.  Pt given 4 hour dose of procardia and bolus of fluids and oain settled down.  Good FM.  Filed Vitals:   02/26/15 0517 02/26/15 0519 02/26/15 0752 02/26/15 1148  BP: 101/50 101/50 109/54 104/51  Pulse:  62 64 71  Temp:   98.5 F (36.9 C) 98.4 F (36.9 C)  TempSrc:   Oral Oral  Resp:   20 18  Height:      Weight:      SpO2:        Lungs CTA Cor RRR Abd  Soft, gravid, nontender Ex SCDs FHTs  A 130s, B 120s, both good short term variability, NST R; one mild variable of B Toco  Now q5  SVE today 2/60/-2, intact  Results for orders placed or performed during the hospital encounter of 02/22/15 (from the past 24 hour(s))  Type and screen     Status: None   Collection Time: 02/25/15  8:15 PM  Result Value Ref Range   ABO/RH(D) B POS    Antibody Screen NEG    Sample Expiration 02/28/2015     A:  HD#4  7483w1d with Twins and PTL. P: 1. Betamethasone complete 2-25. 2. US 2-25: A 1907 VTX; B transverse left 2005; 5% discord; good fluid. Pt counseled about C/S for transverse presentation of B and avoiding vaginal birth with A and C/S for B- pt to decide.  3. Labs normal, GBS P- on PCN in meantime. 4. Pt currently on procardia- will d/c if stable for 24 hours. S/p MgSO4 for neuroprotection. I d/w pt yesterday that I use procardia to make her more comfortable by stopping contractions that are not labor. We discussed that it was reasonable for her not to take it but if her contractions were decreased, she would be in less pain and we could avoid vaginal checks to see if she was in labor. She was ok for taking q 6 hours. Pt's contractions are decreased now; may skip next dose if pt desires.    Jerrie Schussler A

## 2015-02-27 MED ORDER — NIFEDIPINE 10 MG PO CAPS: 10.0000 mg | ORAL_CAPSULE | ORAL | Status: DC | PRN

## 2015-02-27 NOTE — Progress Notes (Addendum)
Pt off the monitor after reassurring FHR, MD aware of the pt having contractions, but not painful.

## 2015-02-27 NOTE — Discharge Summary (Signed)
Obstetric Discharge Summary Reason for Admission: preterm labor Prenatal Procedures: NST and ultrasound Intrapartum Procedures: n/a Postpartum Procedures: n/a Complications-Operative and Postpartum: none HEMOGLOBIN  Date Value Ref Range Status  02/22/2015 11.7* 12.0 - 15.0 g/dL Final   HCT  Date Value Ref Range Status  02/22/2015 33.8* 36.0 - 46.0 % Final    Physical Exam:  Abd  Soft, gravid, nontender Ex SCDs FHTs  A: 120s  B: 130s , moderate variability accels no decels Toco  q 3-8 minutes  Cervix: not evaluated  Last check 2 cm  Discharge Diagnoses: Premature labor  Discharge Information: Date: 02/27/2015 Activity: pelvic rest Diet: routine Medications: PNV and procardia Condition: stable Instructions: refer to practice specific booklet Discharge to: home   Newborn Data: This patient is still pregnant   Tonya HartINN, Deborha Moseley [name] 02/27/2015, 8:33 AM

## 2015-02-27 NOTE — Progress Notes (Signed)
Name: Tonya [name] [facility] Medical Record Number:  [id number] Date of Birth: 1986-09-28 Date of Service: 02/27/2015  29 y.o. [id number] 7149w2d HD#5 admitted for Twin Preterm labor  Pt currently stable with no complaints.   She denies painful contractions, notes she feels abdominal tightening and back pain has resolved.  She denies vaginal bleeding, no leaking of fluid. Reports good FM.  The patient'[name] past medical history and prenatal records were reviewed.  Additional issues addressed and updated today: Patient Active Problem List   Diagnosis Date Noted  . Dichorionic diamniotic twin gestation   . [redacted] weeks gestation of pregnancy   . Preterm labor without delivery   . Preterm labor 02/22/2015   History reviewed. No pertinent family history. History   Social History  . Marital Status: Married    Spouse Name: N/A  . Number of Children: N/A  . Years of Education: N/A   Social History Main Topics  . Smoking status: Never Smoker   . Smokeless tobacco: Not on file  . Alcohol Use: No  . Drug Use: No  . Sexual Activity: Not on file   Other Topics Concern  . None   Social History Narrative   Filed Vitals:   02/27/15 0612  BP: 108/56  Pulse:   Temp:   Resp:      Physical Examination:   Filed Vitals:   02/27/15 0612  BP: 108/56  Pulse:   Temp:   Resp:    Abd  Soft, gravid, nontender Ex SCDs FHTs  A: 120s  B: 130s , moderate variability accels no decels Toco  q 3-8 minutes  Cervix: not evaluated  Last check 2 cm  No results found for this or any previous visit (from the past 24 hour([name])).  A:  HD#5  7149w2d admitted for PTL, patient currently not in active labor.  P: Patient not in pain, has had vaginal deliveries before so she knows when she is in labor. We discussed staying vs going home. She feels comfortable going home.  We discussed labor precautions. She has a f/u office visit already scheduled for Friday.  1. Betamethasone complete 2-25. 2. US 2-25: A 1907 VTX; B  transverse left 2005; 5% discord; good fluid. Pt counseled about Duel set up for delivery with possible breech extraction of B vs. C/[name] for transverse presentation of B and avoiding vaginal birth with A and C/[name] for B  3. Labs normal, GBS drawn 4. Pt currently on procardia- will send patient home on prn procardia.   [name]/p MgSO4 for neuroprotection. I d/w pt yesterday that I use procardia to make her more comfortable by stopping contractions that are not labor.   Sunset Joshi Tonya [name]

## 2015-03-04 ENCOUNTER — Inpatient Hospital Stay (HOSPITAL_COMMUNITY): Payer: Medicaid Other

## 2015-03-04 ENCOUNTER — Inpatient Hospital Stay (HOSPITAL_COMMUNITY)
Admission: AD | Admit: 2015-03-04 | Discharge: 2015-03-04 | Disposition: A | Discharge status: Home / Self Care | Payer: Medicaid Other | Source: Ambulatory Visit | Attending: Obstetrics and Gynecology | Admitting: Obstetrics and Gynecology

## 2015-03-04 ENCOUNTER — Encounter (HOSPITAL_COMMUNITY): Payer: Self-pay

## 2015-03-04 DIAGNOSIS — O2343 Unspecified infection of urinary tract in pregnancy, third trimester: Secondary | ICD-10-CM

## 2015-03-04 DIAGNOSIS — Z3A33 33 weeks gestation of pregnancy: Secondary | ICD-10-CM | POA: Diagnosis not present

## 2015-03-04 DIAGNOSIS — O30003 Twin pregnancy, unspecified number of placenta and unspecified number of amniotic sacs, third trimester: Secondary | ICD-10-CM | POA: Diagnosis not present

## 2015-03-04 DIAGNOSIS — R1031 Right lower quadrant pain: Secondary | ICD-10-CM | POA: Diagnosis not present

## 2015-03-04 DIAGNOSIS — O4703 False labor before 37 completed weeks of gestation, third trimester: Secondary | ICD-10-CM

## 2015-03-04 DIAGNOSIS — O30043 Twin pregnancy, dichorionic/diamniotic, third trimester: Secondary | ICD-10-CM | POA: Insufficient documentation

## 2015-03-04 LAB — CBC WITH DIFFERENTIAL/PLATELET
BASOS PCT: 0 % (ref 0–1)
Basophils Absolute: 0 10*3/uL (ref 0.0–0.1)
Eosinophils Absolute: 0 10*3/uL (ref 0.0–0.7)
Eosinophils Relative: 0 % (ref 0–5)
HCT: 35.1 % — ABNORMAL LOW (ref 36.0–46.0)
HEMOGLOBIN: 12.2 g/dL (ref 12.0–15.0)
Lymphocytes Relative: 18 % (ref 12–46)
Lymphs Abs: 1.6 10*3/uL (ref 0.7–4.0)
MCH: 32.7 pg (ref 26.0–34.0)
MCHC: 34.8 g/dL (ref 30.0–36.0)
MCV: 94.1 fL (ref 78.0–100.0)
Monocytes Absolute: 0.9 10*3/uL (ref 0.1–1.0)
Monocytes Relative: 11 % (ref 3–12)
NEUTROS PCT: 71 % (ref 43–77)
Neutro Abs: 6.3 10*3/uL (ref 1.7–7.7)
Platelets: 178 10*3/uL (ref 150–400)
RBC: 3.73 MIL/uL — ABNORMAL LOW (ref 3.87–5.11)
RDW: 12.4 % (ref 11.5–15.5)
WBC: 8.9 10*3/uL (ref 4.0–10.5)

## 2015-03-04 LAB — URINALYSIS, ROUTINE W REFLEX MICROSCOPIC
BILIRUBIN URINE: NEGATIVE
Glucose, UA: NEGATIVE mg/dL
KETONES UR: NEGATIVE mg/dL
Nitrite: NEGATIVE
Protein, ur: NEGATIVE mg/dL
Specific Gravity, Urine: <1.005 — ABNORMAL LOW (ref 1.005–1.030)
UROBILINOGEN UA: 0.2 mg/dL (ref 0.0–1.0)
pH: 5.5 (ref 5.0–8.0)

## 2015-03-04 LAB — URINE MICROSCOPIC-ADD ON

## 2015-03-04 MED ORDER — NITROFURANTOIN MONOHYD MACRO 100 MG PO CAPS: 100.0000 mg | ORAL_CAPSULE | Freq: Two times a day (BID) | ORAL | Status: DC

## 2015-03-04 NOTE — MAU Note (Signed)
Contractions worsened.  No bleeding.  No leaking.  Babies moving well. TWINs.

## 2015-03-04 NOTE — MAU Provider Note (Signed)
History     CSN: [id number]  Arrival date and time: 03/04/15 2008   First Provider Initiated Contact with Patient 03/04/15 2036      Chief Complaint  Patient presents with  . Contractions   HPI Tonya [name] 29 y.o. [id number]  twin gestation presents to MAU with contractions, pressure and complaining of RLQ "ball pulling."  She states within the last hour she has had a pulling feeling in her right lower belly that is very painful.  She is continuing to have contractions but states these are overall unchanged.  She was discharged from the hospital on 2/29 with contractions and advised to use procardia.  She reports she stopped this on 3/3 due to HA and that Dr. Mora Appl is agreeable.  She also has had some loose stools yesterday and decreased eating today.  She reports drinking well.  She endorses good fetal movement.  She denies nausea, vomiting, fever, weakness, leaking of fluid or bleeding.  She has had no sex in greater than 24 hours.   OB History    Gravida Para Term Preterm AB TAB SAB Ectopic Multiple Living   Past Medical History  Diagnosis Date  . Asthma     Past Surgical History  Procedure Laterality Date  . No past surgeries      Family History  Problem Relation Age of Onset  . Hypertension Father     History  Substance Use Topics  . Smoking status: Never Smoker   . Smokeless tobacco: Never Used  . Alcohol Use: No    Allergies:  Allergies  Allergen Reactions  . Shrimp [Shellfish Allergy] Shortness Of Breath and Itching  . Latex Itching and Other (See Comments)    Causes burning.    Prescriptions prior to admission  Medication Sig Dispense Refill Last Dose  . NIFEdipine (PROCARDIA) 10 MG capsule Take 1 capsule (10 mg total) by mouth every 4 (four) hours as needed. 60 capsule 3 Past Week at Unknown time  . OVER THE COUNTER MEDICATION Tums   03/04/2015 at Unknown time  . Prenatal Vit-Fe Fumarate-FA (PRENATAL VITAMINS) 28-0.8 MG TABS  Take 1 capsule by mouth daily. 30 tablet 0 03/03/2015 at Unknown time  . ranitidine (ZANTAC) 150 MG tablet Take 150 mg by mouth 2 (two) times daily as needed for heartburn.   03/03/2015 at Unknown time  . Fluticasone-Salmeterol (ADVAIR) 250-50 MCG/DOSE AEPB Inhale 2 puffs into the lungs daily as needed (wheezing).   More than a month at Unknown time    ROS Pertinent ROS in HPI  Physical Exam   Pulse 98, temperature 98.5 F (36.9 C), temperature source Oral, resp. rate 18, last menstrual period 07/21/2014, SpO2 100 %.  Physical Exam  Constitutional: She is oriented to person, place, and time. She appears well-developed and well-nourished. No distress.  HENT:  Head: Normocephalic and atraumatic.  Eyes: EOM are normal.  Neck: Normal range of motion.  Cardiovascular: Normal rate.   Respiratory: Effort normal and breath sounds normal.  GI: Soft. Bowel sounds are normal. She exhibits no distension. There is tenderness.  Tender in RLQ.  Guards this particular area.  Genitourinary:  FFN initially collected   Cervix checked - 1.5cm  Later cervical check - no change  Musculoskeletal: Normal range of motion.  Neurological: She is alert and oriented to person, place, and time.  Skin: Skin is warm and dry.  Psychiatric: She has a  normal mood and affect.   Fetal Tracing: Baseline:          Baby A 135         Baby B 145 Variability:                    Mod  mod Accelerations:        15x15 x2  15x15 x2 Decelerations:            None  none Toco: q 3-6 minutes, irregular   MAU Course  Procedures  MDM Discussed with Dr. Fredia SorrowK Ross.  She advises for CBC with diff, U/A, ultrasound.  Hold on FFN Cervix rechecked after u/s complete: no change CBC is neg U/A suggests possible UTI U/S neg per prelim report Discussed again with Dr. Tenny Crawoss.  She is in agreement to d/c pt with Macrobid for UTI.  F/u in office  Assessment and Plan  A:  1. Threatened preterm labor, third trimester   2. RLQ abdominal  pain   3. Twin gestation in third trimester   4. UTI in pregnancy, third trimester     P: Discharge to home Pelvic rest Macrobid Push PO fluids F/u in clinic prn/as scheduled Patient may return to MAU as needed or if her condition were to change or worsen    Bertram Denvereague Clark, Laia Wiley E 03/04/2015, 8:39 PM

## 2015-03-04 NOTE — Discharge Instructions (Signed)
Pregnancy and Urinary Tract Infection °A urinary tract infection (UTI) is a bacterial infection of the urinary tract. Infection of the urinary tract can include the ureters, kidneys (pyelonephritis), bladder (cystitis), and urethra (urethritis). All pregnant women should be screened for bacteria in the urinary tract. Identifying and treating a UTI will decrease the risk of preterm labor and developing more serious infections in both the mother and baby. °CAUSES °Bacteria germs cause almost all UTIs.  °RISK FACTORS °Many factors can increase your chances of getting a UTI during pregnancy. These include: °· Having a short urethra. °· Poor toilet and hygiene habits. °· Sexual intercourse. °· Blockage of urine along the urinary tract. °· Problems with the pelvic muscles or nerves. °· Diabetes. °· Obesity. °· Bladder problems after having several children. °· Previous history of UTI. °SIGNS AND SYMPTOMS  °· Pain, burning, or a stinging feeling when urinating. °· Suddenly feeling the need to urinate right away (urgency). °· Loss of bladder control (urinary incontinence). °· Frequent urination, more than is common with pregnancy. °· Lower abdominal or back discomfort. °· Cloudy urine. °· Blood in the urine (hematuria). °· Fever.  °When the kidneys are infected, the symptoms may be: °· Back pain. °· Flank pain on the right side more so than the left. °· Fever. °· Chills. °· Nausea. °· Vomiting. °DIAGNOSIS  °A urinary tract infection is usually diagnosed through urine tests. Additional tests and procedures are sometimes done. These may include: °· Ultrasound exam of the kidneys, ureters, bladder, and urethra. °· Looking in the bladder with a lighted tube (cystoscopy). °TREATMENT °Typically, UTIs can be treated with antibiotic medicines.  °HOME CARE INSTRUCTIONS  °· Only take over-the-counter or prescription medicines as directed by your health care provider. If you were prescribed antibiotics, take them as directed. Finish  them even if you start to feel better. °· Drink enough fluids to keep your urine clear or pale yellow. °· Do not have sexual intercourse until the infection is gone and your health care provider says it is okay. °· Make sure you are tested for UTIs throughout your pregnancy. These infections often come back.  °Preventing a UTI in the Future °· Practice good toilet habits. Always wipe from front to back. Use the tissue only once. °· Do not hold your urine. Empty your bladder as soon as possible when the urge comes. °· Do not douche or use deodorant sprays. °· Wash with soap and warm water around the genital area and the anus. °· Empty your bladder before and after sexual intercourse. °· Wear underwear with a cotton crotch. °· Avoid caffeine and carbonated drinks. They can irritate the bladder. °· Drink cranberry juice or take cranberry pills. This may decrease the risk of getting a UTI. °· Do not drink alcohol. °· Keep all your appointments and tests as scheduled.  °SEEK MEDICAL CARE IF:  °· Your symptoms get worse. °· You are still having fevers 2 or more days after treatment begins. °· You have a rash. °· You feel that you are having problems with medicines prescribed. °· You have abnormal vaginal discharge. °SEEK IMMEDIATE MEDICAL CARE IF:  °· You have back or flank pain. °· You have chills. °· You have blood in your urine. °· You have nausea and vomiting. °· You have contractions of your uterus. °· You have a gush of fluid from the vagina. °MAKE SURE YOU: °· Understand these instructions.   °· Will watch your condition.   °· Will get help right away if you are not doing   well or get worse.  Document Released: 04/12/2011 Document Revised: 10/06/2013 Document Reviewed: 07/15/2013 [facility] Patient Information 2015 [place], [place]. This information is not intended to replace advice given to you by your health care provider. Make sure you discuss any questions you have with your health care provider.  Pelvic  Rest Pelvic rest is sometimes recommended for women when:   The placenta is partially or completely covering the opening of the cervix (placenta previa).  There is bleeding between the uterine wall and the amniotic sac in the first trimester (subchorionic hemorrhage).  The cervix begins to open without labor starting (incompetent cervix, cervical insufficiency).  The labor is too early (preterm labor). HOME CARE INSTRUCTIONS  Do not have sexual intercourse, stimulation, or an orgasm.  Do not use tampons, douche, or put anything in the vagina.  Do not lift anything over 10 pounds (4.5 kg).  Avoid strenuous activity or straining your pelvic muscles. SEEK MEDICAL CARE IF:  You have any vaginal bleeding during pregnancy. Treat this as a potential emergency.  You have cramping pain felt low in the stomach (stronger than menstrual cramps).  You notice vaginal discharge (watery, mucus, or bloody).  You have a low, dull backache.  There are regular contractions or uterine tightening. SEEK IMMEDIATE MEDICAL CARE IF: You have vaginal bleeding and have placenta previa.  Document Released: 04/12/2011 Document Revised: 03/09/2012 Document Reviewed: 04/12/2011 [facility] Patient Information 2015 [place], [place]. This information is not intended to replace advice given to you by your health care provider. Make sure you discuss any questions you have with your health care provider.

## 2015-03-12 ENCOUNTER — Inpatient Hospital Stay (HOSPITAL_COMMUNITY)
Admission: AD | Admit: 2015-03-12 | Discharge: 2015-03-15 | DRG: 775 | Disposition: A | Discharge status: Home / Self Care | Payer: Medicaid Other | Source: Ambulatory Visit | Attending: Obstetrics & Gynecology | Admitting: Obstetrics & Gynecology

## 2015-03-12 ENCOUNTER — Encounter (HOSPITAL_COMMUNITY): Payer: Self-pay | Admitting: *Deleted

## 2015-03-12 DIAGNOSIS — O30043 Twin pregnancy, dichorionic/diamniotic, third trimester: Secondary | ICD-10-CM | POA: Diagnosis present

## 2015-03-12 DIAGNOSIS — Z3A34 34 weeks gestation of pregnancy: Secondary | ICD-10-CM | POA: Diagnosis present

## 2015-03-12 LAB — TYPE AND SCREEN
ABO/RH(D): B POS
Antibody Screen: NEGATIVE

## 2015-03-12 LAB — CBC
HCT: 35.5 % — ABNORMAL LOW (ref 36.0–46.0)
Hemoglobin: 12.1 g/dL (ref 12.0–15.0)
MCH: 31.8 pg (ref 26.0–34.0)
MCHC: 34.1 g/dL (ref 30.0–36.0)
MCV: 93.4 fL (ref 78.0–100.0)
Platelets: 126 10*3/uL — ABNORMAL LOW (ref 150–400)
RBC: 3.8 MIL/uL — ABNORMAL LOW (ref 3.87–5.11)
RDW: 12.2 % (ref 11.5–15.5)
WBC: 8.1 10*3/uL (ref 4.0–10.5)

## 2015-03-12 MED ORDER — WITCH HAZEL-GLYCERIN EX PADS: 1.0000 "application " | MEDICATED_PAD | CUTANEOUS | Status: DC | PRN

## 2015-03-12 MED ORDER — OXYTOCIN 10 UNIT/ML IJ SOLN
INTRAMUSCULAR | Status: AC
  Administered 2015-03-12: 10 [IU]
  Filled 2015-03-12: qty 2

## 2015-03-12 MED ORDER — OXYTOCIN BOLUS FROM INFUSION: 500.0000 mL | INTRAVENOUS | Status: DC

## 2015-03-12 MED ORDER — MISOPROSTOL 200 MCG PO TABS
600.0000 ug | ORAL_TABLET | Freq: Once | ORAL | Status: AC
  Administered 2015-03-12: 600 ug via ORAL

## 2015-03-12 MED ORDER — ONDANSETRON HCL 4 MG/2ML IJ SOLN: 4.0000 mg | INTRAMUSCULAR | Status: DC | PRN

## 2015-03-12 MED ORDER — LACTATED RINGERS IV SOLN: 500.0000 mL | INTRAVENOUS | Status: DC | PRN

## 2015-03-12 MED ORDER — BUTORPHANOL TARTRATE 1 MG/ML IJ SOLN: 1.0000 mg | INTRAMUSCULAR | Status: DC | PRN

## 2015-03-12 MED ORDER — MISOPROSTOL 200 MCG PO TABS: 600.0000 ug | ORAL_TABLET | Freq: Once | ORAL | Status: DC

## 2015-03-12 MED ORDER — METHYLERGONOVINE MALEATE 0.2 MG/ML IJ SOLN: 0.2000 mg | INTRAMUSCULAR | Status: DC | PRN

## 2015-03-12 MED ORDER — CITRIC ACID-SODIUM CITRATE 334-500 MG/5ML PO SOLN: 30.0000 mL | ORAL | Status: DC | PRN

## 2015-03-12 MED ORDER — OXYCODONE-ACETAMINOPHEN 5-325 MG PO TABS: 1.0000 | ORAL_TABLET | ORAL | Status: DC | PRN

## 2015-03-12 MED ORDER — ZOLPIDEM TARTRATE 5 MG PO TABS: 5.0000 mg | ORAL_TABLET | Freq: Every evening | ORAL | Status: DC | PRN

## 2015-03-12 MED ORDER — ACETAMINOPHEN 325 MG PO TABS: 650.0000 mg | ORAL_TABLET | ORAL | Status: DC | PRN

## 2015-03-12 MED ORDER — LIDOCAINE HCL (PF) 1 % IJ SOLN
30.0000 mL | INTRAMUSCULAR | Status: DC | PRN
  Filled 2015-03-12: qty 30

## 2015-03-12 MED ORDER — BENZOCAINE-MENTHOL 20-0.5 % EX AERO: 1.0000 "application " | INHALATION_SPRAY | CUTANEOUS | Status: DC | PRN

## 2015-03-12 MED ORDER — OXYTOCIN 40 UNITS IN LACTATED RINGERS INFUSION - SIMPLE MED
INTRAVENOUS | Status: AC
  Filled 2015-03-12: qty 1000

## 2015-03-12 MED ORDER — OXYTOCIN 40 UNITS IN LACTATED RINGERS INFUSION - SIMPLE MED: 62.5000 mL/h | INTRAVENOUS | Status: DC | PRN

## 2015-03-12 MED ORDER — TETANUS-DIPHTH-ACELL PERTUSSIS 5-2.5-18.5 LF-MCG/0.5 IM SUSP
0.5000 mL | Freq: Once | INTRAMUSCULAR | Status: AC
  Administered 2015-03-13: 0.5 mL via INTRAMUSCULAR
  Filled 2015-03-12: qty 0.5

## 2015-03-12 MED ORDER — ONDANSETRON HCL 4 MG PO TABS: 4.0000 mg | ORAL_TABLET | ORAL | Status: DC | PRN

## 2015-03-12 MED ORDER — OXYTOCIN 40 UNITS IN LACTATED RINGERS INFUSION - SIMPLE MED: 62.5000 mL/h | INTRAVENOUS | Status: DC

## 2015-03-12 MED ORDER — SIMETHICONE 80 MG PO CHEW: 80.0000 mg | CHEWABLE_TABLET | ORAL | Status: DC | PRN

## 2015-03-12 MED ORDER — IBUPROFEN 600 MG PO TABS
600.0000 mg | ORAL_TABLET | Freq: Four times a day (QID) | ORAL | Status: DC
  Administered 2015-03-12 – 2015-03-15 (×10): 600 mg via ORAL
  Filled 2015-03-12 (×12): qty 1

## 2015-03-12 MED ORDER — DIBUCAINE 1 % RE OINT: 1.0000 "application " | TOPICAL_OINTMENT | RECTAL | Status: DC | PRN

## 2015-03-12 MED ORDER — PRENATAL MULTIVITAMIN CH
1.0000 | ORAL_TABLET | Freq: Every day | ORAL | Status: DC
  Administered 2015-03-13 – 2015-03-15 (×3): 1 via ORAL
  Filled 2015-03-12 (×3): qty 1

## 2015-03-12 MED ORDER — METHYLERGONOVINE MALEATE 0.2 MG PO TABS: 0.2000 mg | ORAL_TABLET | ORAL | Status: DC | PRN

## 2015-03-12 MED ORDER — SENNOSIDES-DOCUSATE SODIUM 8.6-50 MG PO TABS
2.0000 | ORAL_TABLET | ORAL | Status: DC
  Administered 2015-03-12 – 2015-03-14 (×3): 2 via ORAL
  Filled 2015-03-12 (×3): qty 2

## 2015-03-12 MED ORDER — OXYCODONE-ACETAMINOPHEN 5-325 MG PO TABS: 2.0000 | ORAL_TABLET | ORAL | Status: DC | PRN

## 2015-03-12 MED ORDER — ONDANSETRON HCL 4 MG/2ML IJ SOLN: 4.0000 mg | Freq: Four times a day (QID) | INTRAMUSCULAR | Status: DC | PRN

## 2015-03-12 MED ORDER — LACTATED RINGERS IV SOLN: INTRAVENOUS | Status: DC

## 2015-03-12 MED ORDER — LANOLIN HYDROUS EX OINT: TOPICAL_OINTMENT | CUTANEOUS | Status: DC | PRN

## 2015-03-12 MED ORDER — DIPHENHYDRAMINE HCL 25 MG PO CAPS: 25.0000 mg | ORAL_CAPSULE | Freq: Four times a day (QID) | ORAL | Status: DC | PRN

## 2015-03-12 NOTE — Consult Note (Signed)
Neonatology Note:   Attendance at Delivery:    I was asked by Dr. Emelda FearFerguson (in for Dr. Mora ApplPinn due to precipitous delivery) to attend this NSVD of twins at 2534 1/7 weeks. The mother is a [id number] B pos, GBS neg with a history of preterm labor. She got Betamethasone on 2/24-25 and had been on Procardia until 3/3, when she began to have headaches. She was diagnosed with a UTI on 3/5 and was on Macrobid. She also has had recent URI symptoms, but no fever. She presented today, in labor and precipitously delivered Twin A, a female, in the toilet in MAU. ROM occurred shortly before delivery, fluid clear. Infant vigorous with good spontaneous cry and tone. Our team arrived when he was a few minutes old, assessed him, and assumed his care. Needed only minimal bulb suctioning. Ap 8/9. Lungs clear to ausc in DR. He was transported to the NICU for further care.  Twin B, a female, was born just after her mother transferred to L & D. NSVD. Infant was floppy, blue, and had copious clear secretions, which I bulb suctioned. She had a cough and grimace, but her HR was below 100, so PPV was applied. We continued PPV for about 2 minutes, with prompt increase in her HR and color. We placed a pulse oximeter. She began to cry at 3 minutes and regained normal tone at that time. She needed BBO2 to maintain O2 saturations within expected parameters and dropped to the 70s when it was withdrawn, so we transported her to NICU on BBO2. Ap 3/9.  I spoke with the parents in the DR and the baby's father accompanied us to the NICU.    Doretha Souhristie C. Kadence Mimbs, MD

## 2015-03-12 NOTE — Progress Notes (Signed)
Called to patient bedside as patient delivered Baby A precipitously in the bathroom of the MAU.  [facility] Wyvonnia DuskyMarie Lawson with Dr. Emelda FearFerguson at bedside after it turned vertex from  Transverse position.  I evaluated patient post delivery, there were no lacerations and bleeding was well controlled.   Both neonates were transferred to the NICU.   Jerad Dunlap [name]

## 2015-03-12 NOTE — H&P (Signed)
Tonya [name] is a 29 y.o. female admitted post partum as she delivered precipitously Baby A in the MAU after only  One or two contractions.   Baby B was also delivered shortly afterwards.     History OB History    Gravida Para Term Preterm AB TAB SAB Ectopic Multiple Living   6 2 2  3 3    2      Past Medical History  Diagnosis Date  . Asthma    Past Surgical History  Procedure Laterality Date  . No past surgeries     Family History: family history includes Hypertension in her father. Social History:  reports that she has never smoked. She has never used smokeless tobacco. She reports that she does not drink alcohol or use illicit drugs.   Prenatal Transfer Tool  Maternal Diabetes: No Genetic Screening: Normal Maternal Ultrasounds/Referrals: Normal Fetal Ultrasounds or other Referrals:  Referred to Materal Fetal Medicine  Maternal Substance Abuse:  No Significant Maternal Medications:  None Significant Maternal Lab Results:  None Other Comments:  None  ROS    Last menstrual period 07/21/2014. Exam Physical Exam  Prenatal labs: ABO, Rh: --/--/B POS (02/27 2015) Antibody: NEG (02/27 2015) Rubella:  Immune RPR: Nonreactive (10/16 0000)  HBsAg: Negative (10/16 0000)  HIV: Non-reactive (10/16 0000)  GBS:     Assessment/Plan: Di/Di twin pregnancy presented to MAU in active labor  Precipitously delivered baby A in MAU and baby B on labor and delivery Patient will be watched closely post partum for bleeding Babies in NICU   Tonya [name], Tonya [name] 03/12/2015, 3:07 PM

## 2015-03-12 NOTE — MAU Note (Signed)
Patient presented to MAU complaining of SROM and requesting to use the restroom. Denied offer of wheel chair and ambulated in stable condition to restroom. Patient screamed and pulled emergency cord in restroom. RNs at desk, Daiva Nakayama CNM and Fatima Blank CNM ran to restroom. Patient observed sitting on toilet with Baby A in toilet bowl with vigourous crying. Rockwell Alexandria CNM clamped and cut umbilical cord for Baby A and Baby A transferred to infant warmer. Patient transferred to stretcher in hallway with assistance from [place] and cervical exam performed. Dr. Glo Herring performed bedside ultrasound to confirm vertex presentation. Rockwell Alexandria CNM and Dr. Glo Herring requested pt be transported to labor and delivery before delivery of baby B. Labor and delivery called to make aware of transport. NICU team called to make aware of preterm delivery of twins. Pt transported in stretcher with Rockwell Alexandria CNM, Dr. Glo Herring, C. Doug Sou RN, Ruffin Frederick RN with baby A due to baby not having identification band. Marga Hoots RN and S. Quentin Cornwall RN went to stay in labor and delivery to assist with delivery and recovery of twin B and mother. Met by labor and delivery staff in room 171.

## 2015-03-13 ENCOUNTER — Encounter (HOSPITAL_COMMUNITY): Payer: Self-pay | Admitting: *Deleted

## 2015-03-13 LAB — CBC
HEMATOCRIT: 32.5 % — AB (ref 36.0–46.0)
Hemoglobin: 11.3 g/dL — ABNORMAL LOW (ref 12.0–15.0)
MCH: 32.7 pg (ref 26.0–34.0)
MCHC: 34.8 g/dL (ref 30.0–36.0)
MCV: 93.9 fL (ref 78.0–100.0)
Platelets: 125 10*3/uL — ABNORMAL LOW (ref 150–400)
RBC: 3.46 MIL/uL — AB (ref 3.87–5.11)
RDW: 12.2 % (ref 11.5–15.5)
WBC: 9.7 10*3/uL (ref 4.0–10.5)

## 2015-03-13 NOTE — Progress Notes (Signed)
Ur chart review completed.  

## 2015-03-14 NOTE — Discharge Summary (Signed)
Obstetric Discharge Summary Reason for Admission: onset of labor Prenatal Procedures: PTL, steroids Intrapartum Procedures: spontaneous vaginal deliveryx2 Postpartum Procedures: none Complications-Operative and Postpartum: none HEMOGLOBIN  Date Value Ref Range Status  03/13/2015 11.3* 12.0 - 15.0 g/dL Final   HCT  Date Value Ref Range Status  03/13/2015 32.5* 36.0 - 46.0 % Final    Discharge Diagnoses: Premature labor  Discharge Information: Date: 03/14/2015 Activity: pelvic rest Diet: routine Medications: Ibuprofen and Iron Condition: stable Instructions: refer to practice specific booklet Discharge to: home Follow-up Information    Follow up with PINN, Sanjuana MaeWALDA [name], MD In 4 weeks.   Specialty:  Obstetrics and Gynecology   Contact information:   [address] [place] [address] [phone]       Newborn Data:   Arliss JourneyHarbor, BoyA Dyesha [[id number]][[id number]]  Live born female  Birth Weight: 5 lb 8.2 oz (2500 g) APGAR: , [address]   Printup, Dimas ChyleGirlB Dyamon [[id number]][[id number]]  Live born female  Birth Weight: 5 lb 2.7 oz (2345 g) APGAR: 3, 9  Home with NICU for prematurity.  Zethan Alfieri A 03/14/2015, 8:01 AM

## 2015-03-14 NOTE — Progress Notes (Signed)
CSW attempted to meet with MOB to complete assessment due to NICU admission, but she was not in her room at this time.  CSW will attempt again at a later time. 

## 2015-03-14 NOTE — Progress Notes (Signed)
Patient is eating, ambulating, voiding.  Pain control is good.  Filed Vitals:   03/13/15 1248 03/13/15 1857 03/13/15 2122 03/14/15 0610  BP: 126/75 133/76 139/84 140/85  Pulse: 69 59 57 56  Temp: 98.5 F (36.9 C) 98.4 F (36.9 C) 98.6 F (37 C) 97.8 F (36.6 C)  TempSrc: Oral Oral Oral Oral  Resp: 18 18 18 18   Height:      Weight:      SpO2: 99% 100% 100% 100%    Fundus firm Perineum without swelling.  Lab Results  Component Value Date   WBC 9.7 03/13/2015   HGB 11.3* 03/13/2015   HCT 32.5* 03/13/2015   MCV 93.9 03/13/2015   PLT 125* 03/13/2015    --/--/B POS (03/13 1515)/RI  A/P Post partum day 2.  Routine care.  Expect d/c today if BPs reduce- if persist, will observe for 24 more hours.    Kashvi Prevette A

## 2015-03-14 NOTE — Progress Notes (Signed)
Dr.Horvath notified of pt's vital signs at 1641p.m. 138/88. No new orders . Pt to continue to have blood pressure monitiored.

## 2015-03-15 NOTE — Progress Notes (Signed)
Pt  Is discharged  In the care of husband.with R.N. Daine GipEscort. Denies any pain or discomfort. No equipment needed for home use. Stable. Understands all;discharge instructions well Questions asked and answered. Infant twins to remain in [place]. Coping well with infants status.

## 2015-03-16 ENCOUNTER — Ambulatory Visit: Payer: Self-pay

## 2015-03-16 NOTE — Progress Notes (Signed)
Clinical Social Work Department PSYCHOSOCIAL ASSESSMENT - MATERNAL/CHILD 03/15/2015-Late Entry  Patient:  Cockerham,Nikiesha S  Account Number:  [id number]  Admit Date:  03/12/2015  Childs Name:   A: Karter  B: Kennedy    Clinical Social Worker:  Jeania Nater, LCSW   Date/Time:  03/15/2015 01:30 PM  Date Referred:        Other referral source:   No referral-NICU admission    I:  FAMILY / HOME ENVIRONMENT Child's legal guardian:  PARENT  Guardian - Name Guardian - Age Guardian - Address  Moncerrat Lint [address] [place], [place] [address]  Jacque Ridgeway  same   Other household support members/support persons Name Relationship DOB   SON 13   SON 5   Other support:   Parents report having a good support system    II  PSYCHOSOCIAL DATA Information Source:  Family Interview  Financial and Community Resources Employment:   Financial resources:  Medicaid If Medicaid - County:  [place]  School / Grade:   Maternity Care Coordinator / Child Services Coordination / Early Interventions:   CC4C  Cultural issues impacting care:   None stated    III  STRENGTHS Strengths  Adequate Resources  Compliance with medical plan  Home prepared for Child (including basic supplies)  Other - See comment  Supportive family/friends  Understanding of illness   Strength comment:  Pediatric follow up will be at [facility].   IV  RISK FACTORS AND CURRENT PROBLEMS Current Problem:  None   Risk Factor & Current Problem Patient Issue Family Issue Risk Factor / Current Problem Comment   N N     V  SOCIAL WORK ASSESSMENT  CSW met with parents at babies' bedsides to introduce myself, offer support and complete assessment due to NICU admission of twins at 34.1 weeks.  CSW especially wanted to offer support in light of the passing of a baby in the bed space next to the twins.  Parents welcomed CSW's visit and state that today has been difficult.  They acknowledged understanding of  what has been going on in the room and thankfulness for the health of their own babies.  CSW helped them process their emotions related to the preterm birth of their twins, NICU admission, and MOB's discharge today.  These babies are FOB's first, although he acts as father to MOB's other two children.  Both parents are happy about the progress twins are making but acknowledge sadness about going home without them today.  CSW validated their feelings and encouraged them to allow themselves to be emotional about this.  CSW discussed common emotional reactions to the NICU experience as well as PPD signs and symptoms.  MOB denies any symptoms after her other births.  CSW encouraged parents to enjoy bonding with infants and not to try not to have expectations regarding discharge so not to be let down if babies do not meet these expectations.  Parents stated understanding.  They report having a good support system and everything they need for the babies at home.  They seemed appreciative of the support offered.  CSW explained ongoing support services offered by NICU CSW and gave contact information.  CSW has no social concerns at this time.  Parents state no questions, concerns or needs at this time and appear to be coping well.     VI SOCIAL WORK PLAN Social Work Plan  Psychosocial Support/Ongoing Assessment of Needs  Patient/Family Education   Type of pt/family education:     PPD signs and symptoms  Ongoing support services offered by NICU CSW   If child protective services report - county:   If child protective services report - date:   Information/referral to community resources comment:   No referral needs noted at this time.   Other social work plan:   

## 2015-03-16 NOTE — Lactation Note (Signed)
This note was copied from the chart of [facility]. Lactation Consultation Note  Patient Name: Tonya [name] [facility] GEFUW'T Date: 03/16/2015 Reason for consult: Initial assessment;Multiple gestation;NICU baby;Infant < 6lbs  Mom is visiting her twins and her breasts are soft but she desires to pump and provide ebm to babies now.  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  Mom is holding her daughter and is not ready to pump at this time so her nurse said she will assist her with first pumping in pump room later this evening.  LC opened the kit and reviewed assembly, use and cleaning, including use of hand pump option tonight.  LC recommends DEBP when able, or 15 minutes per breast every 3 hours.  LC also provided NICU pumping booklet and puming and storing guidelines for her premature babies.  LC also commented how any milk she can provide will be good for her babies.LC encouraged review of Baby and Me and NICU booklets for breastfeeding and pumping information.    Maternal Data Formula Feeding for Exclusion: Yes (mom has decided to pump and provide ebm to her twins) Reason for exclusion: Admission to Intensive Care Unit (ICU) post-partum  Feeding Feeding Type: Formula Nipple Type: Slow - flow Length of feed: 60 min (20"PO/40"NG)  LATCH Score/Interventions                 N/A - babies in NICU and mom just decided to pump     Lactation Tools Discussed/Used WIC Program: No Pump Review: Setup, frequency, and cleaning;Milk Storage;Other (comment) (provided NICU pumping booklet) Initiated by:: Gordon (RN to assist mom to attach kit to pump in pump room when she is ready Date initiated:: 03/16/15   Consult Status Consult Status: Follow-up Date: 03/17/15 Follow-up type: In-patient    Junious Dresser [facility] 03/16/2015, 7:59 PM

## 2015-03-17 LAB — RPR: RPR: NONREACTIVE

## 2015-03-22 ENCOUNTER — Encounter (HOSPITAL_COMMUNITY): Payer: Self-pay | Admitting: *Deleted

## 2016-04-28 ENCOUNTER — Encounter (HOSPITAL_COMMUNITY): Payer: Self-pay | Admitting: *Deleted

## 2016-04-28 ENCOUNTER — Emergency Department (HOSPITAL_COMMUNITY)
Admission: EM | Admit: 2016-04-28 | Discharge: 2016-04-28 | Disposition: A | Discharge status: Home / Self Care | Payer: Medicaid Other | Attending: Emergency Medicine | Admitting: Emergency Medicine

## 2016-04-28 DIAGNOSIS — J45909 Unspecified asthma, uncomplicated: Secondary | ICD-10-CM | POA: Insufficient documentation

## 2016-04-28 DIAGNOSIS — Z792 Long term (current) use of antibiotics: Secondary | ICD-10-CM | POA: Insufficient documentation

## 2016-04-28 DIAGNOSIS — Y990 Civilian activity done for income or pay: Secondary | ICD-10-CM | POA: Insufficient documentation

## 2016-04-28 DIAGNOSIS — M62838 Other muscle spasm: Secondary | ICD-10-CM | POA: Insufficient documentation

## 2016-04-28 DIAGNOSIS — Z79899 Other long term (current) drug therapy: Secondary | ICD-10-CM | POA: Insufficient documentation

## 2016-04-28 DIAGNOSIS — Y9289 Other specified places as the place of occurrence of the external cause: Secondary | ICD-10-CM | POA: Diagnosis not present

## 2016-04-28 DIAGNOSIS — Z91040 Latex allergy status: Secondary | ICD-10-CM | POA: Diagnosis not present

## 2016-04-28 DIAGNOSIS — S39012A Strain of muscle, fascia and tendon of lower back, initial encounter: Secondary | ICD-10-CM | POA: Diagnosis not present

## 2016-04-28 DIAGNOSIS — S3992XA Unspecified injury of lower back, initial encounter: Secondary | ICD-10-CM | POA: Diagnosis present

## 2016-04-28 DIAGNOSIS — X501XXA Overexertion from prolonged static or awkward postures, initial encounter: Secondary | ICD-10-CM | POA: Diagnosis not present

## 2016-04-28 DIAGNOSIS — Y9389 Activity, other specified: Secondary | ICD-10-CM | POA: Insufficient documentation

## 2016-04-28 DIAGNOSIS — S29002A Unspecified injury of muscle and tendon of back wall of thorax, initial encounter: Secondary | ICD-10-CM | POA: Diagnosis not present

## 2016-04-28 MED ORDER — METHOCARBAMOL 500 MG PO TABS
1000.0000 mg | ORAL_TABLET | Freq: Four times a day (QID) | ORAL | Status: DC
Start: 2016-04-28 — End: 2016-06-20

## 2016-04-28 MED ORDER — TRAMADOL HCL 50 MG PO TABS: 50.0000 mg | ORAL_TABLET | Freq: Four times a day (QID) | ORAL | Status: DC | PRN

## 2016-04-28 MED ORDER — KETOROLAC TROMETHAMINE 60 MG/2ML IM SOLN
60.0000 mg | Freq: Once | INTRAMUSCULAR | Status: AC
  Administered 2016-04-28: 60 mg via INTRAMUSCULAR
  Filled 2016-04-28: qty 2

## 2016-04-28 MED ORDER — NAPROXEN 500 MG PO TABS: 500.0000 mg | ORAL_TABLET | Freq: Two times a day (BID) | ORAL | Status: DC

## 2016-04-28 NOTE — Discharge Instructions (Signed)
Please read and follow all provided instructions.  Your diagnoses today include:  1. Lumbosacral strain, initial encounter    Tests performed today include:  Vital signs - see below for your results today  Medications prescribed:   Robaxin (methocarbamol) - muscle relaxer medication  DO NOT drive or perform any activities that require you to be awake and alert because this medicine can make you drowsy.    Tramadol - narcotic-like pain medication  DO NOT drive or perform any activities that require you to be awake and alert because this medicine can make you drowsy.    Naproxen - anti-inflammatory pain medication  Do not exceed 500mg  naproxen every 12 hours, take with food  You have been prescribed an anti-inflammatory medication or NSAID. Take with food. Take smallest effective dose for the shortest duration needed for your pain. Stop taking if you experience stomach pain or vomiting.   Take any prescribed medications only as directed.  Home care instructions:   Follow any educational materials contained in this packet  Please rest, use ice or heat on your back for the next several days  Do not lift, push, pull anything more than 10 pounds for the next week  Follow-up instructions: Please follow-up with your primary care provider in the next 1 week for further evaluation of your symptoms.   Return instructions:  SEEK IMMEDIATE MEDICAL ATTENTION IF YOU HAVE:  New numbness, tingling, weakness, or problem with the use of your arms or legs  Severe back pain not relieved with medications  Loss control of your bowels or bladder  Increasing pain in any areas of the body (such as chest or abdominal pain)  Shortness of breath, dizziness, or fainting.   Worsening nausea (feeling sick to your stomach), vomiting, fever, or sweats  Any other emergent concerns regarding your health   Additional Information:  Your vital signs today were: BP 121/80 mmHg   Pulse 48    Temp(Src) 97.6 F (36.4 C) (Oral)   Resp 16   SpO2 100% If your blood pressure (BP) was elevated above 135/85 this visit, please have this repeated by your doctor within one month. --------------

## 2016-04-28 NOTE — ED Notes (Signed)
Patient states she was at work this AM and bent over to clean up and felt like something pulled in her back causing her to have trouble getting up

## 2016-04-28 NOTE — ED Provider Notes (Signed)
CSN: [id number]     Arrival date & time 04/28/16  2112 History  By signing my name below, I, Tonya [name], attest that this documentation has been prepared under the direction and in the presence of [name], PA-C. Electronically Signed: Doreatha MartinEva [name], ED Scribe. 04/28/2016. 9:48 PM.    Chief Complaint  Patient presents with  . Back Pain   The history is provided by the patient. No language interpreter was used.   HPI Comments: Tonya [name] is a 30 y.o. female otherwise healthy who presents to the Emergency Department complaining of moderate, dull mid and lower back pain with radiation to the lateral right thigh onset this morning. 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 She denies additional recent traumas, injuries, heavy lifting, falls. Pt states that pain is worsened with movement, ambulation and in certain positions. She notes that she has tried Motrin and resting with no relief of pain. Pt is ambulatory, but with moderate difficulty secondary to pain. No h/o cancer, IVDU, back surgery. Denies bowel or bladder incontinence, saddle anesthesia, fever, cough, abdominal pain, nausea, emesis, dysuria, hematuria, frequency, rash, recent unexplained weight loss. Denies numbness, focal weakness or paresthesia of the lower extremities.    Past Medical History  Diagnosis Date  . Asthma    Past Surgical History  Procedure Laterality Date  . No past surgeries     Family History  Problem Relation Age of Onset  . Hypertension Father    Social History  Substance Use Topics  . Smoking status: Never Smoker   . Smokeless tobacco: Never Used  . Alcohol Use: No   OB History    Gravida Para Term Preterm AB TAB SAB Ectopic Multiple Living   6 3 2 1 3 3   1 2      Review of Systems  Constitutional: Negative for fever and unexpected weight change.  Respiratory:  Negative for cough.   Gastrointestinal: Negative for nausea, vomiting and abdominal pain.       Negative for bowel incontinence.   Genitourinary: Negative for dysuria, frequency and hematuria.       Negative for bladder incontinence and saddle anesthesia.   Musculoskeletal: Positive for back pain.  Skin: Negative for rash.  Neurological: Negative for weakness and numbness.       Negative for paresthesias.    Allergies  Shrimp and Latex  Home Medications   Prior to Admission medications   Medication Sig Start Date End Date Taking? Authorizing Provider  Fluticasone-Salmeterol (ADVAIR) 250-50 MCG/DOSE AEPB Inhale 2 puffs into the lungs daily as needed (for shortness of breath).     Historical Provider, MD  nitrofurantoin, macrocrystal-monohydrate, (MACROBID) 100 MG capsule Take 1 capsule (100 mg total) by mouth 2 (two) times daily. 03/04/15   Bertram DenverKaren E Teague Clark, PA-C  Prenatal Vit-Fe Fumarate-FA (PRENATAL VITAMINS) 28-0.8 MG TABS Take 1 capsule by mouth daily. 08/30/14   Junius FinnerErin O'Malley, PA-C  ranitidine (ZANTAC) 150 MG tablet Take 150 mg by mouth 2 (two) times daily as needed for heartburn.    Historical Provider, MD   BP 121/80 mmHg  Pulse 48  Temp(Src) 97.6 F (36.4 C) (Oral)  Resp 16  SpO2 100% Physical Exam  Constitutional: She appears well-developed and well-nourished.  HENT:  Head: Normocephalic.  Eyes: Conjunctivae are normal.  Cardiovascular: Normal heart sounds.   Pulmonary/Chest: Effort normal. No respiratory distress.  Abdominal:  She exhibits no distension.  Musculoskeletal: Normal range of motion. She exhibits tenderness.       Right shoulder: She exhibits spasm. She exhibits no bony tenderness, no crepitus and no deformity.  No cervical, thoracic or lumbar spinal or paraspinal tenderness. No step-offs, crepitus or deformity. FROM.  Bilateral thoracic and lumbar paraspinal tenderness. Palpable spasms throughout the thoracic and lumbar paraspinal musculature.    Neurological: She is alert. Coordination normal.  Strength and sensation equal and intact bilaterally throughout the upper and lower extremities. Coordination intact.  Antalgic gait.   Skin: Skin is warm and dry.  Psychiatric: She has a normal mood and affect. Her behavior is normal.  Nursing note and vitals reviewed.   ED Course  Procedures (including critical care time) DIAGNOSTIC STUDIES: Oxygen Saturation is 100% on RA, normal by my interpretation.    COORDINATION OF CARE: 9:43 PM Discussed treatment plan with pt at bedside which includes antiinflammatories, muscle relaxer and pt agreed to plan. Advised pt to alternate heat and ice.   Vital signs reviewed and are as follows: Filed Vitals:   04/28/16 2116  BP: 121/80  Pulse: 48  Temp: 97.6 F (36.4 C)  Resp: 16    No red flag s/s of low back pain. Patient was counseled on back pain precautions and told to do activity as tolerated but do not lift, push, or pull heavy objects more than 10 pounds for the next week.  Patient counseled to use ice or heat on back for no longer than 15 minutes every hour.   Patient prescribed muscle relaxer and counseled on proper use of muscle relaxant medication.    Patient prescribed narcotic pain medicine and counseled on proper use of narcotic pain medications. Counseled not to combine this medication with others containing tylenol.   Urged patient not to drink alcohol, drive, or perform any other activities that requires focus while taking either of these medications.  Patient urged to follow-up with PCP if pain does not improve with treatment and rest or if pain becomes recurrent. Urged to return with worsening severe pain, loss of bowel or bladder control, trouble walking.   The patient verbalizes understanding and agrees with the plan.  MDM   Final diagnoses:  None    Patient with back pain.  No neurological deficits and normal neuro exam.  Patient is ambulatory.  No loss of bowel or  bladder control.  No concern for cauda equina. No red flags. No fever, night sweats, weight loss, h/o cancer, IVDA, no recent procedure to back. No urinary symptoms suggestive of UTI.  Supportive care and return precaution discussed. Appears safe for discharge at this time. Follow up as indicated in discharge paperwork.     I personally performed the services described in this documentation, which was scribed in my presence. The recorded information has been reviewed and is accurate.   Renne Crigler, PA-C 04/28/16 2207  Tilden Fossa, MD 05/02/16 [phone]

## 2016-06-20 ENCOUNTER — Emergency Department (HOSPITAL_COMMUNITY)
Admission: EM | Admit: 2016-06-20 | Discharge: 2016-06-20 | Disposition: A | Discharge status: Home / Self Care | Payer: Medicaid Other | Attending: Emergency Medicine | Admitting: Emergency Medicine

## 2016-06-20 ENCOUNTER — Encounter (HOSPITAL_COMMUNITY): Payer: Self-pay | Admitting: Emergency Medicine

## 2016-06-20 DIAGNOSIS — J039 Acute tonsillitis, unspecified: Secondary | ICD-10-CM

## 2016-06-20 DIAGNOSIS — J45909 Unspecified asthma, uncomplicated: Secondary | ICD-10-CM | POA: Diagnosis not present

## 2016-06-20 DIAGNOSIS — J029 Acute pharyngitis, unspecified: Secondary | ICD-10-CM | POA: Diagnosis present

## 2016-06-20 LAB — RAPID STREP SCREEN (MED CTR MEBANE ONLY): Streptococcus, Group A Screen (Direct): NEGATIVE

## 2016-06-20 MED ORDER — AMOXICILLIN 500 MG PO CAPS: 500.0000 mg | ORAL_CAPSULE | Freq: Three times a day (TID) | ORAL | Status: DC

## 2016-06-20 NOTE — ED Provider Notes (Signed)
CSN: [id number]     Arrival date & time 06/20/16  [id number] History   First MD Initiated Contact with Patient 06/20/16 [phone]     Chief Complaint  Patient presents with  . Sore Throat     (Consider location/radiation/quality/duration/timing/severity/associated sxs/prior Treatment) HPI Tonya [name] is a 30 y.o. female presents to emergency department complaining of sore throat. Patient states pain is to the left side of the throat only. States it has been ongoing issue, pain comes and goes. Pain became worse yesterday. Patient noticed R tonsil was enlarged and had exudate on it. She tried to gargle some water with peroxide yesterday which she thinks made it worse. She has not taken any other medications otherwise. Patient denies any fever, chills, congestion, new cough. She states is painful to swallow, otherwise swallowing normally.  Past Medical History  Diagnosis Date  . Asthma    Past Surgical History  Procedure Laterality Date  . No past surgeries     Family History  Problem Relation Age of Onset  . Hypertension Father    Social History  Substance Use Topics  . Smoking status: Never Smoker   . Smokeless tobacco: Never Used  . Alcohol Use: No   OB History    Gravida Para Term Preterm AB TAB SAB Ectopic Multiple Living   6 3 2 1 3 3   1 2      Review of Systems  Constitutional: Negative for fever and chills.  HENT: Positive for sore throat. Negative for congestion.   Respiratory: Negative for cough, chest tightness and shortness of breath.   Cardiovascular: Negative for chest pain, palpitations and leg swelling.  Musculoskeletal: Negative for myalgias, arthralgias, neck pain and neck stiffness.  Skin: Negative for rash.  Neurological: Negative for dizziness, weakness and headaches.  All other systems reviewed and are negative.     Allergies  Shrimp and Latex  Home Medications   Prior to Admission medications   Medication Sig Start Date End Date Taking? Authorizing  Provider  Fluticasone-Salmeterol (ADVAIR) 250-50 MCG/DOSE AEPB Inhale 2 puffs into the lungs daily as needed (for shortness of breath).     Historical Provider, MD  methocarbamol (ROBAXIN) 500 MG tablet Take 2 tablets (1,000 mg total) by mouth 4 (four) times daily. 04/28/16   Renne CriglerJoshua Geiple, PA-C  naproxen (NAPROSYN) 500 MG tablet Take 1 tablet (500 mg total) by mouth 2 (two) times daily. 04/28/16   Renne CriglerJoshua Geiple, PA-C  nitrofurantoin, macrocrystal-monohydrate, (MACROBID) 100 MG capsule Take 1 capsule (100 mg total) by mouth 2 (two) times daily. 03/04/15   Bertram DenverKaren E Teague Clark, PA-C  Prenatal Vit-Fe Fumarate-FA (PRENATAL VITAMINS) 28-0.8 MG TABS Take 1 capsule by mouth daily. 08/30/14   Junius FinnerErin O'Malley, PA-C  ranitidine (ZANTAC) 150 MG tablet Take 150 mg by mouth 2 (two) times daily as needed for heartburn.    Historical Provider, MD  traMADol (ULTRAM) 50 MG tablet Take 1 tablet (50 mg total) by mouth every 6 (six) hours as needed. 04/28/16   Renne CriglerJoshua Geiple, PA-C   BP 122/78 mmHg  Pulse 67  Temp(Src) 98.6 F (37 C) (Oral)  Resp 16  Ht 5\' 3"  (1.6 m)  Wt 70.875 kg  BMI 27.69 kg/m2  SpO2 100%  LMP 06/14/2016 (Exact Date)  Breastfeeding? No Physical Exam  Constitutional: She is oriented to person, place, and time. She appears well-developed and well-nourished. No distress.  HENT:  Head: Normocephalic.  Left tonsil enlarged with exudate. Uvula is midline  Eyes: Conjunctivae are normal.  Neck:  Neck supple.  Cardiovascular: Normal rate, regular rhythm and normal heart sounds.   Pulmonary/Chest: Effort normal and breath sounds normal. No respiratory distress. She has no wheezes. She has no rales.  Musculoskeletal: She exhibits no edema.  Lymphadenopathy:    She has no cervical adenopathy.  Neurological: She is alert and oriented to person, place, and time.  Skin: Skin is warm and dry.  Psychiatric: She has a normal mood and affect. Her behavior is normal.  Nursing note and vitals reviewed.   ED  Course  Procedures (including critical care time) Labs Review Labs Reviewed  RAPID STREP SCREEN (NOT AT [facility])  CULTURE, GROUP A STREP [facility])    Imaging Review No results found. I have personally reviewed and evaluated these images and lab results as part of my medical decision-making.   EKG Interpretation None      MDM   Final diagnoses:  Tonsillitis   Patient with left tonsillar enlargement with exudate. No difficulty swallowing. Afebrile. Rapid strep negative. No evidence of peritonsillar abscess. Pain only on left side. Question tonsillar stones vs infectious process. Will treat with penicillin. Follow up with ENT.   Filed Vitals:   06/20/16 0532 06/20/16 0635 06/20/16 0700 06/20/16 0800  BP: 114/72 122/78 115/72 115/70  Pulse: 73 67 68 73  Temp: 98.6 F (37 C)     TempSrc: Oral     Resp: 18 16  16   Height: 5\' 3"  (1.6 m)     Weight: 70.875 kg     SpO2: 100% 100% 100% 100%     Jaynie Crumbleatyana Dreama Kuna, PA-C 06/20/16 1530  Rolland PorterMark James, MD 06/27/16 0013

## 2016-06-20 NOTE — ED Notes (Signed)
Patient arrives with complaint of left throat pain and swelling. States that she had a mass which she could feel in her neck until last night. States that husband looked at her throat and could obviously see her tonsil on the left side. Currently swelling is much less than before, but pain remains. Denies fever, otalgia, congestion, cough.

## 2016-06-20 NOTE — Discharge Instructions (Signed)
Take ibuprofen for pain. Salt water gargles multiple times a day. Amoxicillin as prescribed until all gone. Follow-up with ear nose throat doctor if continued to have pain and swelling. Return if worsening   Tonsillitis Tonsillitis is an infection of the throat that causes the tonsils to become red, tender, and swollen. Tonsils are collections of lymphoid tissue at the back of the throat. Each tonsil has crevices (crypts). Tonsils help fight nose and throat infections and keep infection from spreading to other parts of the body for the first 18 months of life.  CAUSES Sudden (acute) tonsillitis is usually caused by infection with streptococcal bacteria. Long-lasting (chronic) tonsillitis occurs when the crypts of the tonsils become filled with pieces of food and bacteria, which makes it easy for the tonsils to become repeatedly infected. SYMPTOMS  Symptoms of tonsillitis include:  A sore throat, with possible difficulty swallowing.  White patches on the tonsils.  Fever.  Tiredness.  New episodes of snoring during sleep, when you did not snore before.  Small, foul-smelling, yellowish-white pieces of material (tonsilloliths) that you occasionally cough up or spit out. The tonsilloliths can also cause you to have bad breath. DIAGNOSIS Tonsillitis can be diagnosed through a physical exam. Diagnosis can be confirmed with the results of lab tests, including a throat culture. TREATMENT  The goals of tonsillitis treatment include the reduction of the severity and duration of symptoms and prevention of associated conditions. Symptoms of tonsillitis can be improved with the use of steroids to reduce the swelling. Tonsillitis caused by bacteria can be treated with antibiotic medicines. Usually, treatment with antibiotic medicines is started before the cause of the tonsillitis is known. However, if it is determined that the cause is not bacterial, antibiotic medicines will not treat the tonsillitis. If  attacks of tonsillitis are severe and frequent, your health care provider may recommend surgery to remove the tonsils (tonsillectomy). HOME CARE INSTRUCTIONS   Rest as much as possible and get plenty of sleep.  Drink plenty of fluids. While the throat is very sore, eat soft foods or liquids, such as sherbet, soups, or instant breakfast drinks.  Eat frozen ice pops.  Gargle with a warm or cold liquid to help soothe the throat. Mix 1/4 teaspoon of salt and 1/4 teaspoon of baking soda in 8 oz of water. SEEK MEDICAL CARE IF:   Large, tender lumps develop in your neck.  A rash develops.  A green, yellow-brown, or bloody substance is coughed up.  You are unable to swallow liquids or food for 24 hours.  You notice that only one of the tonsils is swollen. SEEK IMMEDIATE MEDICAL CARE IF:   You develop any new symptoms such as vomiting, severe headache, stiff neck, chest pain, or trouble breathing or swallowing.  You have severe throat pain along with drooling or voice changes.  You have severe pain, unrelieved with recommended medications.  You are unable to fully open the mouth.  You develop redness, swelling, or severe pain anywhere in the neck.  You have a fever. MAKE SURE YOU:   Understand these instructions.  Will watch your condition.  Will get help right away if you are not doing well or get worse.   This information is not intended to replace advice given to you by your health care provider. Make sure you discuss any questions you have with your health care provider.   Document Released: 09/25/2005 Document Revised: 01/06/2015 Document Reviewed: 06/04/2013 Elsevier Interactive Patient Education [employer]2016 Elsevier Inc.

## 2016-06-22 LAB — CULTURE, GROUP A STREP (THRC)

## 2016-08-18 ENCOUNTER — Encounter (HOSPITAL_COMMUNITY): Payer: Self-pay | Admitting: *Deleted

## 2016-08-18 ENCOUNTER — Emergency Department (HOSPITAL_COMMUNITY)
Admission: EM | Admit: 2016-08-18 | Discharge: 2016-08-18 | Disposition: A | Discharge status: Home / Self Care | Payer: Medicaid Other | Attending: Emergency Medicine | Admitting: Emergency Medicine

## 2016-08-18 DIAGNOSIS — Z79899 Other long term (current) drug therapy: Secondary | ICD-10-CM | POA: Insufficient documentation

## 2016-08-18 DIAGNOSIS — Z91040 Latex allergy status: Secondary | ICD-10-CM | POA: Diagnosis not present

## 2016-08-18 DIAGNOSIS — K029 Dental caries, unspecified: Secondary | ICD-10-CM | POA: Diagnosis not present

## 2016-08-18 DIAGNOSIS — J45909 Unspecified asthma, uncomplicated: Secondary | ICD-10-CM | POA: Insufficient documentation

## 2016-08-18 DIAGNOSIS — K0889 Other specified disorders of teeth and supporting structures: Secondary | ICD-10-CM | POA: Diagnosis present

## 2016-08-18 MED ORDER — IBUPROFEN 200 MG PO TABS
600.0000 mg | ORAL_TABLET | Freq: Once | ORAL | Status: AC
  Administered 2016-08-18: 600 mg via ORAL
  Filled 2016-08-18: qty 1

## 2016-08-18 MED ORDER — AMOXICILLIN 500 MG PO CAPS
500.0000 mg | ORAL_CAPSULE | Freq: Once | ORAL | Status: AC
  Administered 2016-08-18: 500 mg via ORAL
  Filled 2016-08-18: qty 1

## 2016-08-18 MED ORDER — AMOXICILLIN 500 MG PO CAPS: 500.0000 mg | ORAL_CAPSULE | Freq: Three times a day (TID) | ORAL | 0 refills | Status: DC

## 2016-08-18 MED ORDER — HYDROCODONE-ACETAMINOPHEN 5-325 MG PO TABS: 1.0000 | ORAL_TABLET | ORAL | 0 refills | Status: DC | PRN

## 2016-08-18 NOTE — ED Triage Notes (Signed)
The pt is c/o some paion in her lt face over her teeth and a lt earache since yesterday  Getting worse.  lmp  None bc

## 2016-08-18 NOTE — ED Notes (Signed)
Pt provided with d/c instructions at this time. Pt verbalizes understanding of d/c instructions as well as follow up procedure after d/c.  Pt provided with RX for Norco and Amoxicillin.  Pt verbalizes understanding of  RX directions. Pt in no apparent distress at this time.  Pt ambulatory at time of d/c.

## 2016-08-18 NOTE — ED Provider Notes (Signed)
[facility]-EMERGENCY DEPT Provider Note   CSN: [id number] Arrival date & time: 08/18/16  0138  By signing my name below, I, Suzan SlickAshley N. Elon SpannerLeger, attest that this documentation has been prepared under the direction and in the presence of Jacalyn LefevreJulie Kendarius Vigen, MD.  Electronically Signed: Suzan SlickAshley N. Elon SpannerLeger, ED Scribe. 08/18/16. 2:09 AM.    History   Chief Complaint Chief Complaint  Patient presents with  . Dental Pain   HPI  HPI Comments: Tonya [name] is a 30 y.o. female who presents to the Emergency Department complaining of constant, worsening L upper dental pain x 1-2 days. Pain is described throbbing. She states "Pt also reports L sided facial swelling.  September 5 PCP: Evelene CroonNIEMEYER, MEINDERT, MD    Past Medical History:  Diagnosis Date  . Asthma     Patient Active Problem List   Diagnosis Date Noted  . Active preterm labor 03/12/2015  . Delivery of twins, both live 03/12/2015  . [redacted] weeks gestation of pregnancy   . Dichorionic diamniotic twin pregnancy in third trimester   . RLQ abdominal pain   . Dichorionic diamniotic twin gestation   . [redacted] weeks gestation of pregnancy   . Preterm labor without delivery   . Preterm labor 02/22/2015    Past Surgical History:  Procedure Laterality Date  . NO PAST SURGERIES      OB History    Gravida Para Term Preterm AB Living   6 3 2 1 3 2    SAB TAB Ectopic Multiple Live Births     3   1 2        Home Medications    Prior to Admission medications   Medication Sig Start Date End Date Taking? Authorizing Provider  amoxicillin (AMOXIL) 500 MG capsule Take 1 capsule (500 mg total) by mouth 3 (three) times daily. 08/18/16   Jacalyn LefevreJulie Genise Strack, MD  celecoxib (CELEBREX) 200 MG capsule Take 200 mg by mouth 2 (two) times daily as needed for mild pain.    Historical Provider, MD  Fluticasone-Salmeterol (ADVAIR) 250-50 MCG/DOSE AEPB Inhale 2 puffs into the lungs daily as needed (for shortness of breath).     Historical Provider, MD    HYDROcodone-acetaminophen (NORCO/VICODIN) 5-325 MG tablet Take 1 tablet by mouth every 4 (four) hours as needed. 08/18/16   Jacalyn LefevreJulie Domanique Luckett, MD  Norgestrel-Ethinyl Estradiol (CRYSELLE-28 PO) Take 1 tablet by mouth at bedtime.    Historical Provider, MD  ranitidine (ZANTAC) 150 MG tablet Take 150 mg by mouth 2 (two) times daily as needed for heartburn.    Historical Provider, MD  Vitamin D, Ergocalciferol, (DRISDOL) 50000 units CAPS capsule Take 50,000 Units by mouth every 7 (seven) days.    Historical Provider, MD    Family History Family History  Problem Relation Age of Onset  . Hypertension Father     Social History Social History  Substance Use Topics  . Smoking status: Never Smoker  . Smokeless tobacco: Never Used  . Alcohol use No     Allergies   Shrimp [shellfish allergy] and Latex   Review of Systems Review of Systems  HENT: Positive for dental problem.   All other systems reviewed and are negative.    Physical Exam Updated Vital Signs BP 142/93 (BP Location: Left Arm)   Pulse 62   Temp 97.9 F (36.6 C) (Oral)   Resp 18   Ht 5\' 3"  (1.6 m)   Wt 157 lb 3 oz (71.3 kg)   SpO2 100%   BMI 27.84 kg/m  Physical Exam  Constitutional: She appears well-developed and well-nourished.  HENT:  Head: Normocephalic and atraumatic.  Left Ear: External ear normal.  Nose: Nose normal.  Mouth/Throat: Oropharynx is clear and moist.  Multiple dental caries to L upper maxilla with mild swelling and tenderness. No abscess noted.   Eyes: Conjunctivae and EOM are normal. Pupils are equal, round, and reactive to light.  Neck: Normal range of motion. Neck supple.  Cardiovascular: Normal rate, regular rhythm, normal heart sounds and intact distal pulses.   Pulmonary/Chest: Effort normal and breath sounds normal.  Abdominal: Soft. Bowel sounds are normal.     ED Treatments / Results   DIAGNOSTIC STUDIES:  COORDINATION OF CARE: 2:09 AM-Discussed treatment plan with pt at  bedside and pt agreed to plan.     Labs (all labs ordered are listed, but only abnormal results are displayed) Labs Reviewed - No data to display  EKG  EKG Interpretation None       Radiology No results found.  Procedures Procedures (including critical care time)  Medications Ordered in ED Medications  amoxicillin (AMOXIL) capsule 500 mg (not administered)  ibuprofen (ADVIL,MOTRIN) tablet 600 mg (not administered)     Initial Impression / Assessment and Plan / ED Course  I have reviewed the triage vital signs and the nursing notes.  Pertinent labs & imaging results that were available during my care of the patient were reviewed by me and considered in my medical decision making (see chart for details).  Clinical Course   Pt will be started on amox and lortab.  She knows to f/u with her dentist.  Final Clinical Impressions(s) / ED Diagnoses   Final diagnoses:  Dental caries    New Prescriptions New Prescriptions   HYDROCODONE-ACETAMINOPHEN (NORCO/VICODIN) 5-325 MG TABLET    Take 1 tablet by mouth every 4 (four) hours as needed.   I personally performed the services described in this documentation, which was scribed in my presence. The recorded information has been reviewed and is accurate.    Charli Halle HavilaJacalyn Lefevrend, MD 08/18/16 [phone]

## 2016-11-04 LAB — LAB REPORT - SCANNED: Chlamydia, Swab/Urine, PCR: NEGATIVE

## 2017-10-06 ENCOUNTER — Ambulatory Visit (INDEPENDENT_AMBULATORY_CARE_PROVIDER_SITE_OTHER): Payer: BLUE CROSS/BLUE SHIELD | Admitting: Family Medicine

## 2017-10-06 ENCOUNTER — Encounter: Payer: Self-pay | Admitting: Family Medicine

## 2017-10-06 VITALS — BP 100/80 | HR 81 | Temp 98.7°F | Ht 64.5 in | Wt 170.0 lb

## 2017-10-06 DIAGNOSIS — Z7689 Persons encountering health services in other specified circumstances: Secondary | ICD-10-CM | POA: Diagnosis not present

## 2017-10-06 DIAGNOSIS — J302 Other seasonal allergic rhinitis: Secondary | ICD-10-CM

## 2017-10-06 MED ORDER — FLUTICASONE PROPIONATE 50 MCG/ACT NA SUSP: 1.0000 | Freq: Every day | NASAL | 4 refills | Status: DC

## 2017-10-06 MED ORDER — LEVOCETIRIZINE DIHYDROCHLORIDE 5 MG PO TABS: 5.0000 mg | ORAL_TABLET | Freq: Every evening | ORAL | 0 refills | Status: DC

## 2017-10-06 NOTE — Patient Instructions (Addendum)
Allergies An allergy is when your body reacts to a substance in a way that is not normal. An allergic reaction can happen after you:  Eat something.  Breathe in something.  Touch something.  You can be allergic to:  Things that are only around during certain seasons, like molds and pollens.  Foods.  Drugs.  Insects.  Animal dander.  What are the signs or symptoms?  Puffiness (swelling). This may happen on the lips, face, tongue, mouth, or throat.  Sneezing.  Coughing.  Breathing loudly (wheezing).  Stuffy nose.  Tingling in the mouth.  A rash.  Itching.  Itchy, red, puffy areas of skin (hives).  Watery eyes.  Throwing up (vomiting).  Watery poop (diarrhea).  Dizziness.  Feeling faint or fainting.  Trouble breathing or swallowing.  A tight feeling in the chest.  A fast heartbeat. How is this diagnosed? Allergies can be diagnosed with:  A medical and family history.  Skin tests.  Blood tests.  A food diary. A food diary is a record of all the foods, drinks, and symptoms you have each day.  The results of an elimination diet. This diet involves making sure not to eat certain foods and then seeing what happens when you start eating them again.  How is this treated? There is no cure for allergies, but allergic reactions can be treated with medicine. Severe reactions usually need to be treated at a hospital. How is this prevented? The best way to prevent an allergic reaction is to avoid the thing you are allergic to. Allergy shots and medicines can also help prevent reactions in some cases. This information is not intended to replace advice given to you by your health care provider. Make sure you discuss any questions you have with your health care provider. Document Released: 04/12/2013 Document Revised: 08/12/2016 Document Reviewed: 09/27/2014 Elsevier Interactive Patient Education  2018 Elsevier Inc.  

## 2017-10-06 NOTE — Progress Notes (Signed)
Patient presents to clinic today to establish care.  SUBJECTIVE: PMH:  Pt is a 31 yo AAF with no sig pmh.  Pt was formerly seen in [place] by Dr. Juluis Pitch?  Acute Concern: -green eye d/c x 1 wk -sneezing since Thursday, rhinorrhea, sore throat/slightly hoarse, somewhat productive cough-worse in am -has tried Allegra-D, zyrtec (stopped working), and Xyzal (worked, but ran out). -head pressure when nose is not running.  Allergies: NKDA  PSurgHx: none  Social Hx: Pt has been married x 3 yrs.  She has 4 children: Cristal Deer is 15yo, Banker, and 33 yo twins- a boy and a girl.  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  She denies tobacco, EtOH, or drug use.  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  FamilyHx: Mom-schizophrenia Dad- HTN PGF- 4 different types of cancer, DM MGM- Ca MGF-CA? PGM-alive, dementia, HTN   Health Maintenance: Dental -- last wk.  Has appt next week for deep cleaning and fillings Vision --needs to have checked Immunizations- needs flu vac for work, but will wait until feeling better PAP -- 3 yrs ago  Past Medical History:  Diagnosis Date  . Asthma     Past Surgical History:  Procedure Laterality Date  . NO PAST SURGERIES      Current Outpatient Prescriptions on File Prior to Visit  Medication Sig Dispense Refill  . Fluticasone-Salmeterol (ADVAIR) 250-50 MCG/DOSE AEPB Inhale 2 puffs into the lungs daily as needed (for shortness of breath).      No current facility-administered medications on file prior to visit.     Allergies  Allergen Reactions  . Shrimp [Shellfish Allergy] Shortness Of Breath and Itching  . Latex Itching and Other (See Comments)    Reaction:  Burning     Family History  Problem Relation Age of Onset  . Hypertension Father     Social History   Social History  . Marital status: Married    Spouse name: N/A  . Number of children: N/A  . Years of education: N/A    Occupational History  . Not on file.   Social History Main Topics  . Smoking status: Never Smoker  . Smokeless tobacco: Never Used  . Alcohol use No  . Drug use: No  . Sexual activity: Yes    Birth control/ protection: Pill, Implant   Other Topics Concern  . Not on file   Social History Narrative  . No narrative on file    ROS General: Denies fever, chills, night sweats, changes in weight, changes in appetite HEENT: Denies headaches, ear pain, changes in vision, rhinorrhea, sore throat    +Rhinorrhea,  Sneezing, sore throat, hoarseness , ocular discharge , head pressure CV: Denies CP, palpitations, SOB, orthopnea Pulm: Denies SOB, cough, wheezing GI: Denies abdominal pain, nausea, vomiting, diarrhea, constipation GU: Denies dysuria, hematuria, frequency, vaginal discharge Msk: Denies muscle cramps, joint pains Neuro: Denies weakness, numbness, tingling Skin: Denies rashes, bruising Psych: Denies depression, anxiety, hallucinations  BP 100/80 (BP Location: Left Arm, Patient Position: Sitting, Cuff Size: Normal)   Pulse 81   Temp 98.7 F (37.1 C) (Oral)   Ht 5' 4.5" (1.638 m)   Wt 170 lb (77.1 kg)   LMP 10/04/2017   BMI 28.73 kg/m   Physical Exam Gen. Pleasant, well developed, well-nourished, in NAD HEENT - [place]/AT, PERRL, no scleral icterus, small amount of clear nasal drainage, pharynx with clear postnasal drainage,  no erythema or exudate.  Allergic shiners ,  slight nasal crease. Neck: No JVD, no thyromegaly Lungs: no use of accessory muscles, CTAB, no wheezes, rales or rhonchi Cardiovascular: RRR, No r/g/m, no peripheral edema Abdomen: BS present, soft, nontender,nondistended, no hepatosplenomegaly Musculoskeletal: No deformities, moves all four extremities, no cyanosis or clubbing, normal tone Neuro:  A&Ox3, CN II-XII intact, normal gait Skin:  Warm, dry, intact, no lesions Psych: normal affect, mood appopriate  No results found for this or any previous visit  (from the past 2160 hour(s)).  Assessment/Plan: Seasonal allergies  -discussed various causes of symptoms.  Also discussed possibility of getting a sinus infection. -Discussed supportive care -Will use allergy medications.  Discussed proper use of nasal spray. -Given RTC precautions - Plan: fluticasone (FLONASE) 50 MCG/ACT nasal spray, levocetirizine (XYZAL ALLERGY 24HR) 5 MG tablet  Encounter to establish care -records release  F/u prn for acute issue.

## 2017-10-07 ENCOUNTER — Telehealth: Payer: Self-pay | Admitting: Family Medicine

## 2017-10-07 ENCOUNTER — Other Ambulatory Visit: Payer: Self-pay | Admitting: Emergency Medicine

## 2017-10-07 DIAGNOSIS — J302 Other seasonal allergic rhinitis: Secondary | ICD-10-CM

## 2017-10-07 MED ORDER — LEVOCETIRIZINE DIHYDROCHLORIDE 5 MG PO TABS: 5.0000 mg | ORAL_TABLET | Freq: Every evening | ORAL | 0 refills | Status: DC

## 2017-10-07 MED ORDER — FLUTICASONE PROPIONATE 50 MCG/ACT NA SUSP: 1.0000 | Freq: Every day | NASAL | 4 refills | Status: DC

## 2017-10-07 NOTE — Telephone Encounter (Signed)
Medication has been resent to patient pharmacy. Nothing further needed.  

## 2017-10-07 NOTE — Telephone Encounter (Signed)
Pharmacy states they did not receive any med request from our office for this pt. Can you resend please?  fluticasone (FLONASE) 50 MCG/ACT nasal spray levocetirizine (XYZAL ALLERGY 24HR) 5 MG tablet  [id number] - [place], [place] - [address]

## 2017-10-08 ENCOUNTER — Telehealth: Payer: Self-pay | Admitting: Family Medicine

## 2017-10-08 NOTE — Telephone Encounter (Signed)
Pt states the visine is not working for her anymore for her allergies. Pt wants to know if Dr Salomon Fick can prescribe an eye drop for allergies.  [id number] - [place], [place] - [address]

## 2017-10-08 NOTE — Telephone Encounter (Signed)
Please advise 

## 2017-10-10 ENCOUNTER — Other Ambulatory Visit: Payer: Self-pay | Admitting: Family Medicine

## 2017-10-10 MED ORDER — OLOPATADINE HCL 0.2 % OP SOLN: 1.0000 [drp] | Freq: Every day | OPHTHALMIC | 0 refills | Status: DC

## 2017-10-10 NOTE — Telephone Encounter (Signed)
rx sent for allergy eye gtt. Be advised the are probably expensive.  May benefit from Optho in future.

## 2017-10-10 NOTE — Telephone Encounter (Signed)
Left a VM for patient regarding new rx being sent in to the pharmacy.

## 2017-10-10 NOTE — Progress Notes (Signed)
Rx for allergy eye gtts sent to pharmacy.  Pt may benefit from seeing ophthalmology in future.

## 2017-10-24 ENCOUNTER — Other Ambulatory Visit: Payer: Self-pay | Admitting: Family Medicine

## 2017-10-24 ENCOUNTER — Telehealth: Payer: Self-pay | Admitting: Emergency Medicine

## 2017-10-24 DIAGNOSIS — J302 Other seasonal allergic rhinitis: Secondary | ICD-10-CM

## 2017-10-24 NOTE — Telephone Encounter (Signed)
Patient would like a refill for Levocetirizine 5 mg tablet. Last refill 10/07/2017 dispensed 14 tablets with zero refill. Please advise.

## 2017-10-28 ENCOUNTER — Telehealth: Payer: Self-pay | Admitting: Emergency Medicine

## 2017-10-28 NOTE — Telephone Encounter (Signed)
Spoke with patient regarding xyzal prescription request. Per. Dr.Banks patient should be able to purchase this medication over the counter. Patient understood and had no further questions

## 2017-11-18 ENCOUNTER — Telehealth: Payer: Self-pay | Admitting: Emergency Medicine

## 2017-11-18 NOTE — Telephone Encounter (Signed)
Spoke with patient regarding paper prescription that was  Received through the fax (Olopatadine HCL). Patient states that she was taking Xzyal OTC and it helped but now she feels like her sx with her is returning and would like to see Dr. Salomon FickBanks as soon as possible. Patient has been scheduled to come in tomorrow at 8:30am

## 2017-11-19 ENCOUNTER — Ambulatory Visit (INDEPENDENT_AMBULATORY_CARE_PROVIDER_SITE_OTHER): Payer: BLUE CROSS/BLUE SHIELD | Admitting: Family Medicine

## 2017-11-19 ENCOUNTER — Encounter: Payer: Self-pay | Admitting: Family Medicine

## 2017-11-19 VITALS — BP 102/80 | HR 65 | Temp 98.3°F | Wt 171.1 lb

## 2017-11-19 DIAGNOSIS — J3089 Other allergic rhinitis: Secondary | ICD-10-CM

## 2017-11-19 MED ORDER — LEVOCETIRIZINE DIHYDROCHLORIDE 5 MG PO TABS: 5.0000 mg | ORAL_TABLET | Freq: Every evening | ORAL | 6 refills | Status: DC

## 2017-11-19 MED ORDER — OLOPATADINE HCL 0.2 % OP SOLN: 1.0000 [drp] | Freq: Every day | OPHTHALMIC | 2 refills | Status: DC

## 2017-11-19 NOTE — Patient Instructions (Addendum)
Allergies, Adult An allergy is when your body's defense system (immune system) overreacts to an otherwise harmless substance (allergen) that you breathe in or eat or something that touches your skin. When you come into contact with something that you are allergic to, your immune system produces certain proteins (antibodies). These proteins cause cells to release chemicals (histamines) that trigger the symptoms of an allergic reaction. Allergies often affect the nasal passages (allergic rhinitis), eyes (allergic conjunctivitis), skin (atopic dermatitis), and stomach. Allergies can be mild or severe. Allergies cannot spread from person to person (are not contagious). They can develop at any age and may be outgrown. What increases the risk? You may be at greater risk of allergies if other people in your family have allergies. What are the signs or symptoms? Symptoms depend on what type of allergy you have. They may include:  Runny, stuffy nose.  Sneezing.  Itchy mouth, ears, or throat.  Postnasal drip.  Sore throat.  Itchy, red, watery, or puffy eyes.  Skin rash or hives.  Stomach pain.  Vomiting.  Diarrhea.  Bloating.  Wheezing or coughing.  People with a severe allergy to food, medicine, or an insect bite may have a life-threatening allergic reaction (anaphylaxis). Symptoms of anaphylaxis include:  Hives.  Itching.  Flushed face.  Swollen lips, tongue, or mouth.  Tight or swollen throat.  Chest pain or tightness in the chest.  Trouble breathing or shortness of breath.  Rapid heartbeat.  Dizziness or fainting.  Vomiting.  Diarrhea.  Pain in the abdomen.  How is this diagnosed? This condition is diagnosed based on:  Your symptoms.  Your family and medical history.  A physical exam.  You may need to see a health care provider who specializes in treating allergies (allergist). You may also have tests, including:  Skin tests to see which allergens are  causing your symptoms, such as: ? Skin prick test. In this test, your skin is pricked with a tiny needle and exposed to small amounts of possible allergens to see if your skin reacts. ? Intradermal skin test. In this test, a small amount of allergen is injected under your skin to see if your skin reacts. ? Patch test. In this test, a small amount of allergen is placed on your skin and then your skin is covered with a bandage. Your health care provider will check your skin after a couple of days to see if a rash has developed.  Blood tests.  Challenges tests. In this test, you inhale a small amount of allergen by mouth to see if you have an allergic reaction.  You may also be asked to:  Keep a food diary. A food diary is a record of all the foods and drinks you have in a day and any symptoms you experience.  Practice an elimination diet. An elimination diet involves eliminating specific foods from your diet and then adding them back in one by one to find out if a certain food causes an allergic reaction.  How is this treated? Treatment for allergies depends on your symptoms. Treatment may include:  Cold compresses to soothe itching and swelling.  Eye drops.  Nasal sprays.  Using a saline spray or container (neti pot) to flush out the nose (nasal irrigation). These methods can help clear away mucus and keep the nasal passages moist.  Using a humidifier.  Oral antihistamines or other medicines to block allergic reaction and inflammation.  Skin creams to treat rashes or itching.  Diet changes to   eliminate food allergy triggers.  Repeated exposure to tiny amounts of allergens to build up a tolerance and prevent future allergic reactions (immunotherapy). These include: ? Allergy shots. ? Oral treatment. This involves taking small doses of an allergen under the tongue (sublingual immunotherapy).  Emergency epinephrine injection (auto-injector) in case of an allergic emergency. This is  a self-injectable, pre-measured medicine that must be given within the first few minutes of a serious allergic reaction.  Follow these instructions at home:  Avoid known allergens whenever possible.  If you suffer from airborne allergens, wash out your nose daily. You can do this with a saline spray or a neti pot to flush out your nose (nasal irrigation).  Take over-the-counter and prescription medicines only as told by your health care provider.  Keep all follow-up visits as told by your health care provider. This is important.  If you are at risk of a severe allergic reaction (anaphylaxis), keep your auto-injector with you at all times.  If you have ever had anaphylaxis, wear a medical alert bracelet or necklace that states you have a severe allergy. Contact a health care provider if:  Your symptoms do not improve with treatment. Get help right away if:  You have symptoms of anaphylaxis, such as: ? Swollen mouth, tongue, or throat. ? Pain or tightness in your chest. ? Trouble breathing or shortness of breath. ? Dizziness or fainting. ? Severe abdominal pain, vomiting, or diarrhea. This information is not intended to replace advice given to you by your health care provider. Make sure you discuss any questions you have with your health care provider. Document Released: 03/11/2003 Document Revised: 08/15/2016 Document Reviewed: 07/03/2016 Elsevier Interactive Patient Education  2018 [employer] Allergy Skin Testing Why am I having this test? Allergy skin testing is done to check whether you have an allergy to something. Testing may be done in one of two ways:  Injecting a small amount of the substance you may be allergic to (allergen).  Applying patches to your skin.  Your health care provider will determine the results of your test by checking for an allergic reaction on the skin where the allergen was injected or where the patches were applied. How do I collect samples at  home? If you will receive an injection, you will not need to do anything at home. If patches will be applied to your skin, you will need to:  Wear them for 48 hours.  Return to your health care provider's office to have them removed. Do not remove them yourself.  Avoid bathing and activities that cause heavy sweating until after the patches are removed.  How do I prepare for this test?  Let your health care provider know about all medicines you are taking, including vitamins, herbs, eye drops, creams, and over-the-counter medicines. Some medicines can affect test results. Your health care provider will let you know when to stop taking those medicines and when you can begin taking them again.  If you are having a patch test: ? Do not apply ointments, creams, or lotion to the skin where the patch will be placed. Usually, the patches are placed on your forearm or on your back. ? Bring any items that you think you are allergic to, such as cosmetics, soaps, and perfume. What are the reference ranges? Reference ranges are considered healthy ranges established after testing a large group of healthy people. Reference ranges may vary among different people, labs, and hospitals. The reference range for allergy skin testing is  a swollen area of skin (wheal) less than 3mm in diameter, with surrounding redness and swelling (flare) less than 10mm in diameter. What do the results mean?  A result within the reference range means you are probably not allergic to the allergen.  A result in which the wheal is 3mm or more and the flare is 10mm or more means you are likely allergic to the allergen. Your health care provider will consider the results of your test in addition to your symptoms before diagnosing you with an allergy. Talk with your health care provider to discuss your results, treatment options, and if necessary, the need for more tests. Talk with your health care provider if you have any questions  about your results. This information is not intended to replace advice given to you by your health care provider. Make sure you discuss any questions you have with your health care provider. Document Released: 01/08/2005 Document Revised: 05/23/2016 Document Reviewed: 09/27/2014 Elsevier Interactive Patient Education  [employer]2018 [employer] Inc[employer]

## 2017-11-19 NOTE — Progress Notes (Signed)
Subjective:    Patient ID: Tonya [name], female    DOB: March 20, 1986, 31 y.o.   MRN: [id number]  Chief Complaint  Patient presents with  . Follow-up    HPI Patient was seen today for follow-up on allergies.  Pt endorses cough, nasal congestion, runny left eye.  States was doing well on prescription Xyzal, Flonase, and olopatadine eye drops.  Pt says when she switched to OTC Xyzal she had to take it twice a day to relieve symptoms.  Patient also states eye drops helped tremendously, it prevented daily.  Patient is out of Xyzal and olopatadine, requesting refills.   Pt has noticed more allergy issues since moving.  Her husband recently changed the air filters in the house, but she does not more trees around the property.  Pt endorses increased allergy symptoms after having twins.  Past Medical History:  Diagnosis Date  . Asthma     Allergies  Allergen Reactions  . Shrimp [Shellfish Allergy] Shortness Of Breath and Itching  . Latex Itching and Other (See Comments)    Reaction:  Burning     ROS General: Denies fever, chills, night sweats, changes in weight, changes in appetite HEENT: Denies headaches, ear pain, changes in vision, rhinorrhea, sore throat   + sneezing, runny left eye, nasal congestion CV: Denies CP, palpitations, SOB, orthopnea Pulm: Denies SOB, wheezing  +cough GI: Denies abdominal pain, nausea, vomiting, diarrhea, constipation GU: Denies dysuria, hematuria, frequency, vaginal discharge Msk: Denies muscle cramps, joint pains Neuro: Denies weakness, numbness, tingling Skin: Denies rashes, bruising Psych: Denies depression, anxiety, hallucinations     Objective:    Blood pressure 102/80, pulse 65, temperature 98.3 F (36.8 C), temperature source Oral, weight 171 lb 1.6 oz (77.6 kg), SpO2 98 %.   Gen. Pleasant, well-nourished, in no distress, normal affect  HEENT: [place]/AT, face symmetric, no scleral icterus, PERRLA, nares patent with mild drainage, pharynx with mild  erythema, postnasal drainage, no exudate. Lungs: no accessory muscle use, CTAB, no wheezes or rales Cardiovascular: RRR, no m/r/g, no peripheral edema Neuro:  A&Ox3, CN II-XII intact, normal gait Skin:  Warm, no lesions/ rash   Wt Readings from Last 3 Encounters:  11/19/17 171 lb 1.6 oz (77.6 kg)  10/06/17 170 lb (77.1 kg)  08/18/16 157 lb 3 oz (71.3 kg)    Lab Results  Component Value Date   WBC 9.7 03/13/2015   HGB 11.3 (L) 03/13/2015   HCT 32.5 (L) 03/13/2015   PLT 125 (L) 03/13/2015   GLUCOSE 94 09/03/2014   ALT 8 08/30/2014   AST 12 08/30/2014   NA 136 (L) 09/03/2014   K 4.1 09/03/2014   CL 104 09/03/2014   CREATININE 0.75 09/03/2014   BUN 11 09/03/2014   CO2 22 09/03/2014    Assessment/Plan:  Environmental and seasonal allergies - Plan: Olopatadine HCl 0.2 % SOLN, levocetirizine (XYZAL) 5 MG tablet, Ambulatory referral to Allergy   F/u prn

## 2018-01-09 ENCOUNTER — Ambulatory Visit: Payer: Self-pay | Admitting: Allergy

## 2018-02-20 ENCOUNTER — Ambulatory Visit: Payer: Self-pay | Admitting: Allergy

## 2018-05-06 LAB — HM PAP SMEAR

## 2018-05-14 ENCOUNTER — Encounter: Payer: Self-pay | Admitting: *Deleted

## 2018-05-27 ENCOUNTER — Encounter: Payer: Self-pay | Admitting: Family Medicine

## 2018-05-27 ENCOUNTER — Ambulatory Visit (INDEPENDENT_AMBULATORY_CARE_PROVIDER_SITE_OTHER): Payer: BLUE CROSS/BLUE SHIELD | Admitting: Family Medicine

## 2018-05-27 VITALS — BP 100/60 | HR 60 | Temp 98.0°F | Ht 64.5 in | Wt 176.6 lb

## 2018-05-27 DIAGNOSIS — H938X1 Other specified disorders of right ear: Secondary | ICD-10-CM

## 2018-05-27 NOTE — Progress Notes (Signed)
Subjective:    Patient ID: Tonya [name], female    DOB: 1986/03/18, 32 y.o.   MRN: [id number]  Chief Complaint  Patient presents with  . Foreign Body in Ear    patient complains of "muffling sensation the right ear since this AM while clearing ear with Q-tip    HPI Patient was seen today for acute concern.  Patient states she thinks the end of a Q-tip got stuck in her ear.  Pt endorses feeling a muffling sensation in her ear after cleaning.  Pt states she tried to rinse out her ear.  Pt is still taking Xyzal and Flonase  For allergies.  Past Medical History:  Diagnosis Date  . Asthma     Allergies  Allergen Reactions  . Shrimp [Shellfish Allergy] Shortness Of Breath and Itching  . Latex Itching and Other (See Comments)    Reaction:  Burning     ROS General: Denies fever, chills, night sweats, changes in weight, changes in appetite HEENT: Denies headaches, ear pain, changes in vision, rhinorrhea, sore throat  +fullness in R ear CV: Denies CP, palpitations, SOB, orthopnea Pulm: Denies SOB, cough, wheezing GI: Denies abdominal pain, nausea, vomiting, diarrhea, constipation GU: Denies dysuria, hematuria, frequency, vaginal discharge Msk: Denies muscle cramps, joint pains Neuro: Denies weakness, numbness, tingling Skin: Denies rashes, bruising Psych: Denies depression, anxiety, hallucinations     Objective:    Blood pressure 100/60, pulse 60, temperature 98 F (36.7 C), temperature source Oral, height 5' 4.5" (1.638 m), weight 176 lb 9.6 oz (80.1 kg).   Gen. Pleasant, well-nourished, in no distress, normal affect   HEENT: [place]/AT, face symmetric, no scleral icterus, PERRLA, nares patent without drainage, pharynx without erythema or exudate.  B/l TMs normal, no cerumen or cotton in canals. Lungs: no accessory muscle use  Cardiovascular: RRR, no peripheral edema Neuro:  A&Ox3, CN II-XII intact, normal gait   Wt Readings from Last 3 Encounters:  05/27/18 176 lb 9.6 oz  (80.1 kg)  11/19/17 171 lb 1.6 oz (77.6 kg)  10/06/17 170 lb (77.1 kg)    Lab Results  Component Value Date   WBC 9.7 03/13/2015   HGB 11.3 (L) 03/13/2015   HCT 32.5 (L) 03/13/2015   PLT 125 (L) 03/13/2015   GLUCOSE 94 09/03/2014   ALT 8 08/30/2014   AST 12 08/30/2014   NA 136 (L) 09/03/2014   K 4.1 09/03/2014   CL 104 09/03/2014   CREATININE 0.75 09/03/2014   BUN 11 09/03/2014   CO2 22 09/03/2014    Assessment/Plan:  Sensation of fullness in right ear  -no foreign body in ears noted on exam -discussed allergy symptoms can cause a sensation of fullness. -pt advised to continue allergy meds: flonase and xyzal. -pt advised to notify clinic if symptoms continue or become worse. -pt advised to refrain from using qtips or placing items in ears.  F/u prn  Abbe Amsterdam, MD

## 2018-07-24 ENCOUNTER — Encounter: Payer: Self-pay | Admitting: Family

## 2018-07-24 ENCOUNTER — Ambulatory Visit (INDEPENDENT_AMBULATORY_CARE_PROVIDER_SITE_OTHER): Payer: BC Managed Care – PPO | Admitting: Family

## 2018-07-24 ENCOUNTER — Ambulatory Visit: Payer: Self-pay

## 2018-07-24 VITALS — BP 108/76 | HR 67 | Temp 98.0°F | Ht 64.5 in | Wt 172.1 lb

## 2018-07-24 DIAGNOSIS — R42 Dizziness and giddiness: Secondary | ICD-10-CM | POA: Diagnosis not present

## 2018-07-24 DIAGNOSIS — G43809 Other migraine, not intractable, without status migrainosus: Secondary | ICD-10-CM | POA: Diagnosis not present

## 2018-07-24 MED ORDER — MECLIZINE HCL 25 MG PO TABS: 25.0000 mg | ORAL_TABLET | Freq: Three times a day (TID) | ORAL | 0 refills | Status: DC | PRN

## 2018-07-24 MED ORDER — ONDANSETRON 4 MG PO TBDP: 4.0000 mg | ORAL_TABLET | Freq: Three times a day (TID) | ORAL | 0 refills | Status: DC | PRN

## 2018-07-24 NOTE — Patient Instructions (Signed)

## 2018-07-24 NOTE — Progress Notes (Signed)
Tonya [name] is a 32 y.o. female with the following history as recorded in Epic[employer]:  Patient Active Problem List   Diagnosis Date Noted  . Active preterm labor 03/12/2015  . Delivery of twins, both live 03/12/2015  . [redacted] weeks gestation of pregnancy   . Dichorionic diamniotic twin pregnancy in third trimester   . RLQ abdominal pain   . Dichorionic diamniotic twin gestation   . [redacted] weeks gestation of pregnancy   . Preterm labor without delivery   . Preterm labor 02/22/2015    Current Outpatient Medications  Medication Sig Dispense Refill  . etonogestrel-ethinyl estradiol (NUVARING) 0.12-0.015 MG/24HR vaginal ring NuvaRing 0.12 mg -0.015 mg/24 hr vaginal  Insert 1 vaginal ring every month by vaginal route.    Marland Kitchen. levocetirizine (XYZAL) 5 MG tablet Take 1 tablet (5 mg total) by mouth every evening. 30 tablet 6  . PREVIDENT 5000 SENSITIVE 1.1-5 % PSTE See admin instructions.  99  . fluticasone (FLONASE) 50 MCG/ACT nasal spray Place 1 spray into both nostrils daily. (Patient not taking: Reported on 07/24/2018) 18.2 g 4  . Fluticasone-Salmeterol (ADVAIR) 250-50 MCG/DOSE AEPB Inhale 2 puffs into the lungs daily as needed (for shortness of breath).     . meclizine (ANTIVERT) 25 MG tablet Take 1 tablet (25 mg total) by mouth 3 (three) times daily as needed for dizziness. 30 tablet 0  . Olopatadine HCl 0.2 % SOLN Apply 1 drop to eye daily. (Patient not taking: Reported on 07/24/2018) 1 Bottle 2  . ondansetron (ZOFRAN ODT) 4 MG disintegrating tablet Take 1 tablet (4 mg total) by mouth every 8 (eight) hours as needed for nausea or vomiting. 20 tablet 0   No current facility-administered medications for this visit.     Allergies: Shrimp [shellfish allergy] and Latex  Past Medical History:  Diagnosis Date  . Asthma     Past Surgical History:  Procedure Laterality Date  . NO PAST SURGERIES      Family History  Problem Relation Age of Onset  . Hypertension Father     Social History    Tobacco Use  . Smoking status: Never Smoker  . Smokeless tobacco: Never Used  Substance Use Topics  . Alcohol use: No    Subjective:  Patient presents with concerns for 1 day history of dizziness/ "room spinning around her." Symptoms started suddenly yesterday; feels like she is off balance; + nausea; no vomiting; no recent travel; does have 34 yo twins at home- no illness at home;  Does have history of migraine headaches but notes she does not typically involve dizziness;  LMP- yesterday;   Objective:  Vitals:   07/24/18 1456  BP: 108/76  Pulse: 67  Temp: 98 F (36.7 C)  TempSrc: Oral  SpO2: 99%  Weight: 172 lb 1.3 oz (78.1 kg)  Height: 5' 4.5" (1.638 m)    General: Well developed, well nourished, in no acute distress; prefers to have lights off in room during exam Skin : Warm and dry.  Head: Normocephalic and atraumatic  Eyes: Sclera and conjunctiva clear; pupils round and reactive to light; extraocular movements intact  Ears: External normal; canals clear; tympanic membranes normal  Oropharynx: Pink, supple. No suspicious lesions  Neck: Supple without thyromegaly, adenopathy  Lungs: Respirations unlabored; clear to auscultation bilaterally without wheeze, rales, rhonchi  CVS exam: normal rate and regular rhythm.  Extremities: No edema, cyanosis, clubbing  Vessels: Symmetric bilaterally  Neurologic: Alert and oriented; speech intact; face symmetrical; moves all extremities well; CNII-XII  intact without focal deficit   Assessment:  1. Vertigo   2. Other migraine without status migrainosus, not intractable     Plan:  Suspect atypical migraine presentation-period started yesterday/ ? Hormonal component; trial of Antivert and Zofran for symptoms; strict ER precautions for upcoming weekend; follow-up with her PCP if symptoms persist and further imaging will need to be updated.   No follow-ups on file.  No orders of the defined types were placed in this encounter.    Requested Prescriptions   Signed Prescriptions Disp Refills  . meclizine (ANTIVERT) 25 MG tablet 30 tablet 0    Sig: Take 1 tablet (25 mg total) by mouth 3 (three) times daily as needed for dizziness.  . ondansetron (ZOFRAN ODT) 4 MG disintegrating tablet 20 tablet 0    Sig: Take 1 tablet (4 mg total) by mouth every 8 (eight) hours as needed for nausea or vomiting.

## 2018-07-24 NOTE — Telephone Encounter (Signed)
Returned call to pt.  Reported she had onset of nausea yesterday afternoon, followed by onset of dizziness, about 5:30 PM.  Stated she felt like she could faint at one point last evening; denied fainting.  Described dizziness feels like the room is spinning, intermittently.  Also, reported she felt sharp abdominal pain last night; took out her Nuva ring.  Reported about one hour after Nuva ring removed, the sharp abd. pain subsided.  Today started her menstrual cycle.  Reported her vaginal bleeding is not excessive; it is her usual flow.  Does c/o menstrual cramps, but denied any further sharp abdominal pain. Pulse checked at this time, 68 and regular. Reported she is at work today, and not feeling like herself; reported feeling weak and tired.  Stated the dizziness is moderate, but has been worse with certain movement.  Had headache previously, but denied headache at present.  Denied any vision change, speech difficulty, or weakness of extremities.  Stated she had an episode with numbness of right foot toes x approx. 15 minutes, yesterday.  No further c/o numbness in extremities.   appt. given at 3:00 PM at [facility], as PCP is not available.  Care advice given per protocol.  Verb. Understanding and agrees with plan.            Reason for Disposition . [1] MODERATE dizziness (e.g., interferes with normal activities) AND [2] has NOT been evaluated by physician for this  (Exception: dizziness caused by heat exposure, sudden standing, or poor fluid intake)  Answer Assessment - Initial Assessment Questions 1. DESCRIPTION: "Describe your dizziness."     Feels like the room is spinning  2. LIGHTHEADED: "Do you feel lightheaded?" (e.g., somewhat faint, woozy, weak upon standing)     Felt like she could faint last night; weak, exhausted; has to sit still or close eyes 3. VERTIGO: "Do you feel like either you or the room is spinning or tilting?" (i.e. vertigo)    Room spinning  4. SEVERITY: "How bad is it?"  "Do  you feel like you are going to faint?" "Can you stand and walk?"   - MILD - walking normally   - MODERATE - interferes with normal activities (e.g., work, school)    - SEVERE - unable to stand, requires support to walk, feels like passing out now.      Moderate to severe 5. ONSET:  "When did the dizziness begin?"     Dizziness started about 5:30 7/25 6. AGGRAVATING FACTORS: "Does anything make it worse?" (e.g., standing, change in head position)     Moving  7. HEART RATE: "Can you tell me your heart rate?" "How many beats in 15 seconds?"  (Note: not all patients can do this)       Pulse 68 and reg.  8. CAUSE: "What do you think is causing the dizziness?"     unknown 9. RECURRENT SYMPTOM: "Have you had dizziness before?" If so, ask: "When was the last time?" "What happened that time?"     Not outside of pregnancy 10. OTHER SYMPTOMS: "Do you have any other symptoms?" (e.g., fever, chest pain, vomiting, diarrhea, bleeding)       Intermittent nausea and hot feeling; denied fever/ chills ;  toes felt numb on right foot while walking yesterday for approx. 15 min., denied speech or vision changes, weakness of extremities ; did have stabbing abdominal pain yesterday; this subsided 1 hr. After Nuva ring removed 7/25    11. PREGNANCY: "Is there any  chance you are pregnant?" "When was your last menstrual period?"       Started menstrual cycle today.  Protocols used: DIZZINESS [facility]- LIGHTHEADEDNESS-A-AH  Message from Debroah LoopLumin L Lander sent at 07/24/2018 11:35 AM EDT   Summary: dizziness   Dizziness, nervousness, exhausted, migraines. Nuva ring was removed yesterday and there was a lot of clots and menstrual started.

## 2019-01-15 ENCOUNTER — Emergency Department (HOSPITAL_COMMUNITY)
Admission: EM | Admit: 2019-01-15 | Discharge: 2019-01-16 | Disposition: A | Discharge status: Home / Self Care | Payer: BC Managed Care – PPO | Attending: Emergency Medicine | Admitting: Emergency Medicine

## 2019-01-15 ENCOUNTER — Encounter: Payer: Self-pay | Admitting: Family Medicine

## 2019-01-15 ENCOUNTER — Other Ambulatory Visit: Payer: Self-pay

## 2019-01-15 ENCOUNTER — Encounter (HOSPITAL_COMMUNITY): Payer: Self-pay | Admitting: *Deleted

## 2019-01-15 ENCOUNTER — Ambulatory Visit (INDEPENDENT_AMBULATORY_CARE_PROVIDER_SITE_OTHER): Payer: BC Managed Care – PPO | Admitting: Family Medicine

## 2019-01-15 VITALS — BP 100/70 | HR 66 | Temp 98.2°F | Wt 171.9 lb

## 2019-01-15 DIAGNOSIS — R1031 Right lower quadrant pain: Secondary | ICD-10-CM

## 2019-01-15 DIAGNOSIS — J45909 Unspecified asthma, uncomplicated: Secondary | ICD-10-CM | POA: Diagnosis not present

## 2019-01-15 DIAGNOSIS — Z91040 Latex allergy status: Secondary | ICD-10-CM | POA: Insufficient documentation

## 2019-01-15 DIAGNOSIS — Z79899 Other long term (current) drug therapy: Secondary | ICD-10-CM | POA: Insufficient documentation

## 2019-01-15 LAB — COMPREHENSIVE METABOLIC PANEL
ALT: 13 U/L (ref 0–44)
AST: 15 U/L (ref 15–41)
Albumin: 3.8 g/dL (ref 3.5–5.0)
Alkaline Phosphatase: 48 U/L (ref 38–126)
Anion gap: 7 (ref 5–15)
BUN: 11 mg/dL (ref 6–20)
CO2: 24 mmol/L (ref 22–32)
Calcium: 9.1 mg/dL (ref 8.9–10.3)
Chloride: 108 mmol/L (ref 98–111)
Creatinine, Ser: 0.76 mg/dL (ref 0.44–1.00)
GFR calc Af Amer: >60 mL/min (ref >60)
GFR calc non Af Amer: >60 mL/min (ref >60)
Glucose, Bld: 100 mg/dL — ABNORMAL HIGH (ref 70–99)
Potassium: 3.8 mmol/L (ref 3.5–5.1)
Sodium: 139 mmol/L (ref 135–145)
Total Bilirubin: 0.6 mg/dL (ref 0.3–1.2)
Total Protein: 7.1 g/dL (ref 6.5–8.1)

## 2019-01-15 LAB — CBC
HCT: 40.5 % (ref 36.0–46.0)
Hemoglobin: 13.4 g/dL (ref 12.0–15.0)
MCH: 30.9 pg (ref 26.0–34.0)
MCHC: 33.1 g/dL (ref 30.0–36.0)
MCV: 93.3 fL (ref 80.0–100.0)
Platelets: 291 10*3/uL (ref 150–400)
RBC: 4.34 MIL/uL (ref 3.87–5.11)
RDW: 11.3 % — AB (ref 11.5–15.5)
WBC: 5.3 10*3/uL (ref 4.0–10.5)
nRBC: 0 % (ref 0.0–0.2)

## 2019-01-15 LAB — POCT URINE PREGNANCY: Preg Test, Ur: NEGATIVE

## 2019-01-15 LAB — LIPASE, BLOOD: Lipase: 43 U/L (ref 11–51)

## 2019-01-15 MED ORDER — SODIUM CHLORIDE 0.9 % IV BOLUS
500.0000 mL | Freq: Once | INTRAVENOUS | Status: AC
  Administered 2019-01-16: 500 mL via INTRAVENOUS

## 2019-01-15 MED ORDER — SODIUM CHLORIDE 0.9% FLUSH: 3.0000 mL | Freq: Once | INTRAVENOUS | Status: DC

## 2019-01-15 NOTE — Progress Notes (Addendum)
Laurence Slateamara S Cayson DOB: April 23, 1986 Encounter date: 01/15/2019  This is a 33 y.o. female who presents with Chief Complaint  Patient presents with  . Pain    pain in right side.of lower abdomin, pain is not constant, states that before the pain start she will get nausious and has heat flashes, fatigue,  x 1 week    History of present illness:  Not certain trigger. Weds started more consistently. Weds afternoon didn't go to class. Couldn't stop using bathroom (loose stools). Not still having loose stools. Has had heartburn really badly. More burping today. Has had ulcer in past. No blood in stools. No vomiting, just feels lump in back of throat. No travel out of country, no sick contacts, no foods out of normal for her. No BM since weds. Pain is throbbing at times. Yesterday took advil migraine which did help with pain.   Did just put in new nuvaring.   Throbbing right side, sometimes tender to touch. Hasn't had much appetite. Didn't eat today. Nauseated.   Has been using nuvaring since May. 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       Allergies  Allergen Reactions  . Shrimp [Shellfish Allergy] Shortness Of Breath and Itching  . Latex Itching and Other (See Comments)    Reaction:  Burning    Current Meds  Medication Sig  . etonogestrel-ethinyl estradiol (NUVARING) 0.12-0.015 MG/24HR vaginal ring NuvaRing 0.12 mg -0.015 mg/24 hr vaginal  Insert 1 vaginal ring every month by vaginal route.  . Fluticasone-Salmeterol (ADVAIR) 250-50 MCG/DOSE AEPB Inhale 2 puffs into the lungs daily as needed (for shortness of breath).   Marland Kitchen. levocetirizine (XYZAL) 5 MG tablet Take 1 tablet (5 mg total) by mouth every evening.  . meclizine (ANTIVERT) 25 MG tablet Take 1 tablet (25 mg total) by mouth 3 (three) times daily as needed for dizziness.  . Olopatadine HCl 0.2 % SOLN Apply 1 drop to eye daily.  . ondansetron (ZOFRAN ODT) 4 MG disintegrating tablet Take 1 tablet (4 mg total) by  mouth every 8 (eight) hours as needed for nausea or vomiting.  Marland Kitchen. PREVIDENT 5000 SENSITIVE 1.1-5 % PSTE See admin instructions.    Review of Systems  Constitutional: Positive for chills. Negative for fatigue and fever.  Respiratory: Negative for cough, chest tightness, shortness of breath and wheezing.   Cardiovascular: Negative for chest pain, palpitations and leg swelling.  Gastrointestinal: Positive for abdominal pain, diarrhea and nausea. Negative for anal bleeding, blood in stool, constipation, rectal pain and vomiting.    Objective:  BP 100/70 (BP Location: Left Arm, Patient Position: Sitting, Cuff Size: Normal)   Pulse 66   Temp 98.2 F (36.8 C) (Oral)   Wt 171 lb 14.4 oz (78 kg)   BMI 29.05 kg/m   Weight: 171 lb 14.4 oz (78 kg)   BP Readings from Last 3 Encounters:  01/15/19 100/70  07/24/18 108/76  05/27/18 100/60   Wt Readings from Last 3 Encounters:  01/15/19 171 lb 14.4 oz (78 kg)  07/24/18 172 lb 1.3 oz (78.1 kg)  05/27/18 176 lb 9.6 oz (80.1 kg)    Physical Exam Constitutional:      General: She is not in acute distress.    Appearance: She is well-developed.     Comments: She is uncomfortable after exam with changes in position, walking.  Cardiovascular:     Rate and Rhythm: Normal rate and regular rhythm.     Heart sounds: Normal heart sounds. No murmur. No friction  rub.  Pulmonary:     Effort: Pulmonary effort is normal. No respiratory distress.     Breath sounds: Normal breath sounds. No wheezing or rales.  Abdominal:     General: Bowel sounds are normal.     Tenderness: There is abdominal tenderness in the right lower quadrant. There is guarding and rebound. Positive signs include McBurney's sign.  Genitourinary:    Rectum: Guaiac result negative. Tenderness present. No mass, anal fissure, external hemorrhoid or internal hemorrhoid. Normal anal tone.  Musculoskeletal:     Right lower leg: No edema.     Left lower leg: No edema.  Neurological:      Mental Status: She is alert and oriented to person, place, and time.  Psychiatric:        Behavior: Behavior normal.     Assessment/Plan 1. Right lower quadrant abdominal pain Concerned due to worsening of pain and exam that there may be appendicitis or urgent bowel issue. Unable to get her in for appointment; order printed and she is going to go to [facility] and see if they can work in. If wait too long go to ER. - CT ABDOMEN PELVIS W CONTRAST; Future - CBC with Differential/Platelet; Future - Comprehensive metabolic panel; Future - HCG, Qualitative - POCT urine pregnancy    Return pending labs, Ct result.     Theodis Shove, MD

## 2019-01-15 NOTE — Addendum Note (Signed)
Addended by: Conrad  on: 01/15/2019 03:30 PM   Modules accepted: Orders

## 2019-01-15 NOTE — ED Provider Notes (Signed)
[facility]-EMERGENCY DEPT Provider Note   CSN: [id number] Arrival date & time: 01/15/19  1934     History   Chief Complaint Chief Complaint  Patient presents with  . Abdominal Pain    RLQ    HPI Tonya [name] is a 33 y.o. female.  Patient complains of right lower quadrant abdominal pain for 2 to 3 days.  She saw her family doctor who sent her here for possible appendicitis  The history is provided by the patient. No language interpreter was used.  Abdominal Pain  Pain location:  RLQ Pain quality: aching   Pain radiates to:  Does not radiate Pain severity:  Moderate Onset quality:  Gradual Duration:  3 days Timing:  Constant Progression:  Unchanged Chronicity:  New Context: not alcohol use   Relieved by:  Nothing Worsened by:  Nothing Associated symptoms: no chest pain, no cough, no diarrhea, no fatigue and no hematuria     Past Medical History:  Diagnosis Date  . Asthma     Patient Active Problem List   Diagnosis Date Noted  . Active preterm labor 03/12/2015  . Delivery of twins, both live 03/12/2015  . [redacted] weeks gestation of pregnancy   . Dichorionic diamniotic twin pregnancy in third trimester   . RLQ abdominal pain   . Dichorionic diamniotic twin gestation   . [redacted] weeks gestation of pregnancy   . Preterm labor without delivery   . Preterm labor 02/22/2015    Past Surgical History:  Procedure Laterality Date  . NO PAST SURGERIES       OB History    Gravida  6   Para  3   Term  2   Preterm  1   AB  3   Living  2     SAB      TAB  3   Ectopic      Multiple  1   Live Births  2            Home Medications    Prior to Admission medications   Medication Sig Start Date End Date Taking? Authorizing Provider  ELDERBERRY PO Take 3 tablets by mouth daily.   Yes [provider]  etonogestrel-ethinyl estradiol (NUVARING) 0.12-0.015 MG/24HR vaginal ring Place 1 each vaginally every 28 (twenty-eight)  days.    Yes [provider]  Ibuprofen (ADVIL MIGRAINE) 200 MG CAPS Take 1 capsule by mouth daily as needed (headache).   Yes [provider]  levocetirizine (XYZAL) 5 MG tablet Take 1 tablet (5 mg total) by mouth every evening. 11/19/17  Yes Deeann SaintBanks, Shannon R, MD  Multiple Vitamins-Minerals (HAIR SKIN NAILS PO) Take 2 tablets by mouth daily.   Yes [provider]  Multiple Vitamins-Minerals (ZINC PO) Take 1 tablet by mouth every evening.   Yes [provider]  fluticasone (FLONASE) 50 MCG/ACT nasal spray Place 1 spray into both nostrils daily. Patient taking differently: Place 1 spray into both nostrils daily as needed for allergies.  10/07/17   Deeann SaintBanks, Shannon R, MD  Fluticasone-Salmeterol (ADVAIR) 250-50 MCG/DOSE AEPB Inhale 2 puffs into the lungs daily as needed (for shortness of breath).     [provider]  meclizine (ANTIVERT) 25 MG tablet Take 1 tablet (25 mg total) by mouth 3 (three) times daily as needed for dizziness. 07/24/18   Olive BassMurray, Laura Woodruff, FNP  Olopatadine HCl 0.2 % SOLN Apply 1 drop to eye daily. Patient not taking: Reported on 01/15/2019 11/19/17  Deeann SaintBanks, Shannon R, MD  ondansetron (ZOFRAN ODT) 4 MG disintegrating tablet Take 1 tablet (4 mg total) by mouth every 8 (eight) hours as needed for nausea or vomiting. 07/24/18   Olive BassMurray, Laura Woodruff, FNP    Family History Family History  Problem Relation Age of Onset  . Hypertension Father     Social History Social History   Tobacco Use  . Smoking status: Never Smoker  . Smokeless tobacco: Never Used  Substance Use Topics  . Alcohol use: No  . Drug use: No     Allergies   Shrimp [shellfish allergy] and Latex   Review of Systems Review of Systems  Constitutional: Negative for appetite change and fatigue.  HENT: Negative for congestion, ear discharge and sinus pressure.   Eyes: Negative for discharge.  Respiratory: Negative for cough.   Cardiovascular: Negative for  chest pain.  Gastrointestinal: Positive for abdominal pain. Negative for diarrhea.  Genitourinary: Negative for frequency and hematuria.  Musculoskeletal: Negative for back pain.  Skin: Negative for rash.  Neurological: Negative for seizures and headaches.  Psychiatric/Behavioral: Negative for hallucinations.     Physical Exam Updated Vital Signs BP 129/82 (BP Location: Right Arm)   Pulse 63   Temp 98.7 F (37.1 C) (Oral)   Resp 18   Ht 5\' 3"  (1.6 m)   Wt 77.6 kg   LMP 11/13/2018 (Approximate)   SpO2 100%   BMI 30.29 kg/m   Physical Exam Vitals signs and nursing note reviewed.  Constitutional:      Appearance: She is well-developed.  HENT:     Head: Normocephalic.     Nose: Nose normal.  Eyes:     General: No scleral icterus.    Conjunctiva/sclera: Conjunctivae normal.  Neck:     Musculoskeletal: Neck supple.     Thyroid: No thyromegaly.  Cardiovascular:     Rate and Rhythm: Normal rate and regular rhythm.     Heart sounds: No murmur. No friction rub. No gallop.   Pulmonary:     Breath sounds: No stridor. No wheezing or rales.  Chest:     Chest wall: No tenderness.  Abdominal:     General: There is no distension.     Tenderness: There is abdominal tenderness. There is no rebound.     Comments: Tender right lower quadrant  Musculoskeletal: Normal range of motion.  Lymphadenopathy:     Cervical: No cervical adenopathy.  Skin:    Findings: No erythema or rash.  Neurological:     Mental Status: She is oriented to person, place, and time.     Motor: No abnormal muscle tone.     Coordination: Coordination normal.  Psychiatric:        Behavior: Behavior normal.      ED Treatments / Results  Labs (all labs ordered are listed, but only abnormal results are displayed) Labs Reviewed  COMPREHENSIVE METABOLIC PANEL - Abnormal; Notable for the following components:      Result Value   Glucose, Bld 100 (*)    All other components within normal limits  CBC -  Abnormal; Notable for the following components:   RDW 11.3 (*)    All other components within normal limits  LIPASE, BLOOD  URINALYSIS, ROUTINE W REFLEX MICROSCOPIC  I-STAT BETA HCG BLOOD, ED ([facility], [facility], [facility] ONLY)    EKG None  Radiology No results found.  Procedures Procedures (including critical care time)  Medications Ordered in ED Medications  sodium chloride flush (NS) 0.9 % injection 3  mL (has no administration in time range)  sodium chloride 0.9 % bolus 500 mL (has no administration in time range)     Initial Impression / Assessment and Plan / ED Course  I have reviewed the triage vital signs and the nursing notes.  Pertinent labs & imaging results that were available during my care of the patient were reviewed by me and considered in my medical decision making (see chart for details).     Labs unremarkable.  Urine pregnancy test done today over at the office visit was negative.  We have urinalysis pending and CT scan of the abdomen pending for rule out appendicitis Final Clinical Impressions(s) / ED Diagnoses   Final diagnoses:  None    ED Discharge Orders    None       Bethann Berkshire, MD 01/15/19 2333

## 2019-01-15 NOTE — Addendum Note (Signed)
Addended by: Conrad [place] on: 01/15/2019 03:47 PM   Modules accepted: Orders

## 2019-01-15 NOTE — ED Triage Notes (Signed)
Pt sent here from [facility] for CT.  Labs drawn @ that visit.

## 2019-01-15 NOTE — ED Notes (Signed)
Pt c/o rebound tenderness and describes the pain as throbbing.

## 2019-01-15 NOTE — Addendum Note (Signed)
Addended by: Conrad [place] on: 01/15/2019 03:46 PM   Modules accepted: Orders

## 2019-01-16 ENCOUNTER — Emergency Department (HOSPITAL_COMMUNITY): Payer: BC Managed Care – PPO

## 2019-01-16 ENCOUNTER — Encounter (HOSPITAL_COMMUNITY): Payer: Self-pay

## 2019-01-16 DIAGNOSIS — R1031 Right lower quadrant pain: Secondary | ICD-10-CM | POA: Diagnosis not present

## 2019-01-16 LAB — I-STAT BETA HCG BLOOD, ED (MC, WL, AP ONLY): I-stat hCG, quantitative: <5 m[IU]/mL (ref <5)

## 2019-01-16 LAB — COMPREHENSIVE METABOLIC PANEL
AG Ratio: 1.4 (calc) (ref 1.0–2.5)
ALBUMIN MSPROF: 4 g/dL (ref 3.6–5.1)
ALT: 9 U/L (ref 6–29)
AST: 12 U/L (ref 10–30)
Alkaline phosphatase (APISO): 52 U/L (ref 33–115)
BUN: 10 mg/dL (ref 7–25)
CHLORIDE: 106 mmol/L (ref 98–110)
CO2: 24 mmol/L (ref 20–32)
CREATININE: 0.85 mg/dL (ref 0.50–1.10)
Calcium: 9.5 mg/dL (ref 8.6–10.2)
GLOBULIN: 2.8 g/dL (ref 1.9–3.7)
GLUCOSE: 80 mg/dL (ref 65–99)
POTASSIUM: 4.2 mmol/L (ref 3.5–5.3)
SODIUM: 139 mmol/L (ref 135–146)
Total Bilirubin: 0.6 mg/dL (ref 0.2–1.2)
Total Protein: 6.8 g/dL (ref 6.1–8.1)

## 2019-01-16 LAB — CBC WITH DIFFERENTIAL/PLATELET
ABSOLUTE MONOCYTES: 406 {cells}/uL (ref 200–950)
BASOS ABS: 31 {cells}/uL (ref 0–200)
Basophils Relative: 0.6 %
EOS ABS: 109 {cells}/uL (ref 15–500)
Eosinophils Relative: 2.1 %
HCT: 39.4 % (ref 35.0–45.0)
Hemoglobin: 13.6 g/dL (ref 11.7–15.5)
Lymphs Abs: 2662 cells/uL (ref 850–3900)
MCH: 31.3 pg (ref 27.0–33.0)
MCHC: 34.5 g/dL (ref 32.0–36.0)
MCV: 90.8 fL (ref 80.0–100.0)
MPV: 10.2 fL (ref 7.5–12.5)
Monocytes Relative: 7.8 %
NEUTROS PCT: 38.3 %
Neutro Abs: 1992 cells/uL (ref 1500–7800)
PLATELETS: 314 10*3/uL (ref 140–400)
RBC: 4.34 10*6/uL (ref 3.80–5.10)
RDW: 11.4 % (ref 11.0–15.0)
TOTAL LYMPHOCYTE: 51.2 %
WBC: 5.2 10*3/uL (ref 3.8–10.8)

## 2019-01-16 LAB — URINALYSIS, ROUTINE W REFLEX MICROSCOPIC
Bilirubin Urine: NEGATIVE
Glucose, UA: NEGATIVE mg/dL
Ketones, ur: NEGATIVE mg/dL
Leukocytes, UA: NEGATIVE
Nitrite: NEGATIVE
Protein, ur: NEGATIVE mg/dL
Specific Gravity, Urine: 1.025 (ref 1.005–1.030)
pH: 6 (ref 5.0–8.0)

## 2019-01-16 LAB — HCG, SERUM, QUALITATIVE: Preg, Serum: NEGATIVE

## 2019-01-16 MED ORDER — ONDANSETRON HCL 4 MG PO TABS: 4.0000 mg | ORAL_TABLET | Freq: Three times a day (TID) | ORAL | 0 refills | Status: DC | PRN

## 2019-01-16 MED ORDER — SODIUM CHLORIDE (PF) 0.9 % IJ SOLN
INTRAMUSCULAR | Status: AC
  Filled 2019-01-16: qty 50

## 2019-01-16 MED ORDER — IOPAMIDOL (ISOVUE-300) INJECTION 61%
100.0000 mL | Freq: Once | INTRAVENOUS | Status: AC | PRN
  Administered 2019-01-16: 100 mL via INTRAVENOUS

## 2019-01-16 MED ORDER — IOPAMIDOL (ISOVUE-300) INJECTION 61%
INTRAVENOUS | Status: AC
  Filled 2019-01-16: qty 100

## 2019-01-16 NOTE — ED Notes (Signed)
1103 urinalysis results is in correctly put in. Lab is putting in the correct results.

## 2019-01-16 NOTE — ED Provider Notes (Signed)
12:53 AM Assumed care from Dr. Estell Harpin, please see their note for full history, physical and decision making until this point. In brief this is a 33 y.o. year old female who presented to the ED tonight with Abdominal Pain (RLQ)     CT ok. Urine with rare bacteria and wbc and squamous, will add on culture. Repeat exam, appears well with intermittent RLQ pain, nothing elsewhere. VS WNL. Appears well. Will plan for recheck in ED in 24 hours if not improving. zofran rx.   Discharge instructions, including strict return precautions for new or worsening symptoms, given. Patient and/or family verbalized understanding and agreement with the plan as described.   Labs, studies and imaging reviewed by myself and considered in medical decision making if ordered. Imaging interpreted by radiology.  Labs Reviewed  COMPREHENSIVE METABOLIC PANEL - Abnormal; Notable for the following components:      Result Value   Glucose, Bld 100 (*)    All other components within normal limits  CBC - Abnormal; Notable for the following components:   RDW 11.3 (*)    All other components within normal limits  URINALYSIS, ROUTINE W REFLEX MICROSCOPIC - Abnormal; Notable for the following components:   Color, Urine RED (*)    APPearance TURBID (*)    Glucose, UA   (*)    Value: TEST NOT REPORTED DUE TO COLOR INTERFERENCE OF URINE PIGMENT   Hgb urine dipstick   (*)    Value: TEST NOT REPORTED DUE TO COLOR INTERFERENCE OF URINE PIGMENT   Bilirubin Urine   (*)    Value: TEST NOT REPORTED DUE TO COLOR INTERFERENCE OF URINE PIGMENT   Ketones, ur   (*)    Value: TEST NOT REPORTED DUE TO COLOR INTERFERENCE OF URINE PIGMENT   Protein, ur   (*)    Value: TEST NOT REPORTED DUE TO COLOR INTERFERENCE OF URINE PIGMENT   Nitrite   (*)    Value: TEST NOT REPORTED DUE TO COLOR INTERFERENCE OF URINE PIGMENT   Leukocytes, UA   (*)    Value: TEST NOT REPORTED DUE TO COLOR INTERFERENCE OF URINE PIGMENT   All other components within normal  limits  LIPASE, BLOOD  I-STAT BETA HCG BLOOD, ED ([facility], [facility], [facility] ONLY)    CT ABDOMEN PELVIS W CONTRAST    (Results Pending)    No follow-ups on file.    Lashanna Angelo, Barbara Cower, MD 01/16/19 [phone]

## 2019-01-20 LAB — URINE CULTURE: Culture: >=100000 — AB

## 2019-01-21 ENCOUNTER — Telehealth: Payer: Self-pay

## 2019-01-21 NOTE — Progress Notes (Signed)
ED Antimicrobial Stewardship Positive Culture Follow Up   Tonya [name] is an 33 y.o. female who presented to [facility] on 01/15/2019 with a chief complaint of  Chief Complaint  Patient presents with  . Abdominal Pain    RLQ    Recent Results (from the past 720 hour(s))  Urine culture     Status: Abnormal   Collection Time: 01/15/19 10:25 PM  Result Value Ref Range Status   Specimen Description   Final    URINE, CLEAN CATCH Performed at [facility], [address] [place], [place] [address]    Special Requests   Final    NONE Performed at [facility], [address] [place], [place] [address]    Culture >=100,000 COLONIES/mL ENTEROCOCCUS FAECALIS (A)  Final   Report Status 01/20/2019 FINAL  Final   Organism ID, Bacteria ENTEROCOCCUS FAECALIS (A)  Final      Susceptibility   Enterococcus faecalis - MIC*    AMPICILLIN <=2 SENSITIVE Sensitive     LEVOFLOXACIN 0.5 SENSITIVE Sensitive     NITROFURANTOIN <=16 SENSITIVE Sensitive     VANCOMYCIN 2 SENSITIVE Sensitive     * >=100,000 COLONIES/mL ENTEROCOCCUS FAECALIS    []  Treated with N/A, organism resistant to prescribed antimicrobial [x]  Patient discharged originally without antimicrobial agent and treatment is now indicated  New antibiotic prescription: If pt with symptoms, start macrobid 100mg  PO BID x 5 days  ED Provider: Burna Forts, PA   Yaniv Lage, Drake Leach 01/21/2019, 9:03 AM Clinical Pharmacist Monday - Friday phone -  [phone] Saturday - Sunday phone - [phone]

## 2019-01-21 NOTE — Telephone Encounter (Signed)
Post ED Visit - Positive Culture Follow-up: Unsuccessful Patient Follow-up  Culture assessed and recommendations reviewed by:  []  Enzo Bi, Pharm.D. []  Celedonio Miyamoto, Pharm.D., BCPS AQ-ID []  Garvin Fila, Pharm.D., BCPS []  Georgina Pillion, Pharm.D., BCPS []  [place], [address].D., BCPS, AAHIVP []  Estella Husk, Pharm.D., BCPS, AAHIVP []  Sherlynn Carbon, PharmD []  Pollyann Samples, PharmD, BCPS Fleet Contras Rumbarger Pharm D Positive urine culture For Symptom check  May need abx []  Patient discharged without antimicrobial prescription and treatment is now indicated []  Organism is resistant to prescribed ED discharge antimicrobial []  Patient with positive blood cultures   Unable to contact patient after 3 attempts, letter will be sent to address on file  Tonya [name] 01/21/2019, 1:06 PM

## 2019-03-08 ENCOUNTER — Other Ambulatory Visit: Payer: Self-pay | Admitting: Family Medicine

## 2019-03-08 DIAGNOSIS — J3089 Other allergic rhinitis: Secondary | ICD-10-CM

## 2019-12-17 ENCOUNTER — Other Ambulatory Visit: Payer: Self-pay | Admitting: Family Medicine

## 2019-12-17 DIAGNOSIS — J3089 Other allergic rhinitis: Secondary | ICD-10-CM

## 2019-12-17 NOTE — Telephone Encounter (Signed)
Pt needs appointment for further refills 

## 2020-03-22 ENCOUNTER — Ambulatory Visit: Payer: BC Managed Care – PPO | Attending: Internal Medicine

## 2020-03-22 DIAGNOSIS — Z20822 Contact with and (suspected) exposure to covid-19: Secondary | ICD-10-CM

## 2020-03-23 LAB — NOVEL CORONAVIRUS, NAA: SARS-CoV-2, NAA: NOT DETECTED

## 2020-03-23 LAB — SARS-COV-2, NAA 2 DAY TAT

## 2020-04-30 ENCOUNTER — Other Ambulatory Visit: Payer: Self-pay | Admitting: Family Medicine

## 2020-04-30 DIAGNOSIS — J3089 Other allergic rhinitis: Secondary | ICD-10-CM

## 2020-05-01 NOTE — Telephone Encounter (Signed)
Pt needs appointment for further refills 

## 2020-05-23 ENCOUNTER — Encounter: Payer: Self-pay | Admitting: Family Medicine

## 2020-05-23 ENCOUNTER — Telehealth (INDEPENDENT_AMBULATORY_CARE_PROVIDER_SITE_OTHER): Payer: BC Managed Care – PPO | Admitting: Family Medicine

## 2020-05-23 DIAGNOSIS — R05 Cough: Secondary | ICD-10-CM

## 2020-05-23 DIAGNOSIS — R0981 Nasal congestion: Secondary | ICD-10-CM | POA: Diagnosis not present

## 2020-05-23 DIAGNOSIS — R059 Cough, unspecified: Secondary | ICD-10-CM

## 2020-05-23 DIAGNOSIS — H9203 Otalgia, bilateral: Secondary | ICD-10-CM

## 2020-05-23 DIAGNOSIS — J4521 Mild intermittent asthma with (acute) exacerbation: Secondary | ICD-10-CM

## 2020-05-23 DIAGNOSIS — R42 Dizziness and giddiness: Secondary | ICD-10-CM

## 2020-05-23 MED ORDER — BENZONATATE 100 MG PO CAPS: 100.0000 mg | ORAL_CAPSULE | Freq: Two times a day (BID) | ORAL | 0 refills | Status: DC | PRN

## 2020-05-23 NOTE — Progress Notes (Signed)
Virtual Visit via Telephone Note  I connected with NICOLENA SCHURMAN on 05/23/20 at 11:00 AM EDT by telephone and verified that I am speaking with the correct person using two identifiers. The video portion of the visit was working, but audio did not work due to her being in a car - so completed that via telephone.   I discussed the limitations, risks, security and privacy concerns of performing an evaluation and management service by telephone and the availability of in person appointments. I also discussed with the patient that there may be a patient responsible charge related to this service. The patient expressed understanding and agreed to proceed.  Location patient: home, [place] Location provider: work or home office Participants present for the call: patient, provider, provider Patient did not have a visit in the prior 7 days to address this/these issue(s).   History of Present Illness:  Acute visit for sinus congestion: -started 3 days ago acutely -her young children were sick last week with resp symptoms -her symptoms include nasal congestion, sinus pressure, drainage,  Ears feel stopped up, sore throat, cough, some mild SOB 2 days ago and yesterday when outside - used her inhaler and resolved, some vertigo when bends over -feels like a sinus infection she had in the past with vertigo -denies fevers, loss of taste, SOB today, lethargy, inability to get out of bed, chronic sinus congestion prior to this starting -not vaccinated for covid19, no covid testing for COVID -kids are in daycare -she has asthma  Observations/Objective: Patient sounds cheerful and well on the phone. I do not appreciate any SOB. Speech and thought processing are grossly intact. Patient reported vitals:  Assessment and Plan:  Sinus congestion  Cough  Mild intermittent asthma with acute exacerbation  Discomfort of both ears  Vertigo  -we discussed possible serious and likely etiologies, options for  evaluation and workup, limitations of telemedicine visit vs in person visit, treatment, treatment risks and precautions. Pt prefers to treat via telemedicine empirically rather then risking or undertaking an in person visit at this moment. She has a lot of symptoms, most likely related to an acute resp illness from her children. Discussed various contagious resp illnesses and also the possibility of COVID19. She agrees to testing and will look into that at [facility], discussed outpt treatment options (provided number for this if posistive), potential course, potential complications, reasons to seek in person care. Discussed causes of vertigo and advised inperson eval, but she feels is related to the sinus congestion as has had before an prefers to try a few days of Afrin for the congestion but agrees to seek prompt in person care if worsening, new symptoms arise, or if is not improving with treatment. Advised to not drive with Vertigo.   Follow Up Instructions:   I did not refer this patient for an OV in the next 24 hours for this/these issue(s).  I discussed the assessment and treatment plan with the patient. The patient was provided an opportunity to ask questions and all were answered. The patient agreed with the plan and demonstrated an understanding of the instructions.   The patient was advised to call back or seek an in-person evaluation if the symptoms worsen or if the condition fails to improve as anticipated.  I provided 23 minutes of non-face-to-face time during this encounter.   Terressa Koyanagi, DO

## 2020-05-25 ENCOUNTER — Ambulatory Visit: Payer: BC Managed Care – PPO | Admitting: Family Medicine

## 2020-05-27 ENCOUNTER — Other Ambulatory Visit: Payer: Self-pay | Admitting: Family Medicine

## 2020-05-27 DIAGNOSIS — J3089 Other allergic rhinitis: Secondary | ICD-10-CM

## 2020-06-16 ENCOUNTER — Other Ambulatory Visit: Payer: Self-pay | Admitting: Family Medicine

## 2020-06-16 DIAGNOSIS — J3089 Other allergic rhinitis: Secondary | ICD-10-CM

## 2020-06-29 DIAGNOSIS — Z419 Encounter for procedure for purposes other than remedying health state, unspecified: Secondary | ICD-10-CM | POA: Diagnosis not present

## 2020-07-10 ENCOUNTER — Ambulatory Visit (INDEPENDENT_AMBULATORY_CARE_PROVIDER_SITE_OTHER): Payer: BC Managed Care – PPO | Admitting: Family Medicine

## 2020-07-10 ENCOUNTER — Encounter: Payer: Self-pay | Admitting: Family Medicine

## 2020-07-10 ENCOUNTER — Other Ambulatory Visit (HOSPITAL_COMMUNITY)
Admission: RE | Admit: 2020-07-10 | Discharge: 2020-07-10 | Disposition: A | Discharge status: Home / Self Care | Payer: BC Managed Care – PPO | Source: Ambulatory Visit | Attending: Family Medicine | Admitting: Family Medicine

## 2020-07-10 ENCOUNTER — Other Ambulatory Visit: Payer: Self-pay

## 2020-07-10 VITALS — BP 100/80 | HR 73 | Temp 98.7°F | Ht 63.0 in | Wt 195.0 lb

## 2020-07-10 DIAGNOSIS — A599 Trichomoniasis, unspecified: Secondary | ICD-10-CM | POA: Diagnosis not present

## 2020-07-10 DIAGNOSIS — Z113 Encounter for screening for infections with a predominantly sexual mode of transmission: Secondary | ICD-10-CM

## 2020-07-10 NOTE — Progress Notes (Signed)
Subjective:    Patient ID: Tonya [name], female    DOB: [birth year], 34 y.o.   MRN: [id number]  Chief Complaint  Patient presents with  . Exposure to STD    HPI Patient was seen today for f/u.  Pt requesting an STI check.  Pt seen by OB/Gyn last wk, notified today testing positive for Trich.  Pt having a difficult time hearing the news, upset with her husband, requesting retesting.  Husband denies being unfaithful.  Pt has not picked up rx sent in by OB/Gyn.  Pt denies symptoms.  Past Medical History:  Diagnosis Date  . Asthma     Allergies  Allergen Reactions  . Shrimp [Shellfish Allergy] Shortness Of Breath and Itching  . Latex Itching and Other (See Comments)    Reaction:  Burning     ROS General: Denies fever, chills, night sweats, changes in weight, changes in appetite HEENT: Denies headaches, ear pain, changes in vision, rhinorrhea, sore throat CV: Denies CP, palpitations, SOB, orthopnea Pulm: Denies SOB, cough, wheezing GI: Denies abdominal pain, nausea, vomiting, diarrhea, constipation GU: Denies dysuria, hematuria, frequency, vaginal discharge Msk: Denies muscle cramps, joint pains Neuro: Denies weakness, numbness, tingling Skin: Denies rashes, bruising Psych: Denies depression, anxiety, hallucinations     Objective:    Blood pressure 100/80, pulse 73, temperature 98.7 F (37.1 C), temperature source Other (Comment), height 5\' 3"  (1.6 m), weight 195 lb (88.5 kg), SpO2 99 %.  Gen. Pleasant, well-nourished, in no distress, normal affect  HEENT: [place]/AT, face symmetric, no scleral icterus, PERRLA, EOMI, nares patent without drainage Lungs: no accessory muscle use, CTAB, no wheezes or rales Cardiovascular: RRR, no m/r/g, no peripheral edema GU: self swab collected. Neuro:  A&Ox3, CN II-XII intact, normal gait  Wt Readings from Last 3 Encounters:  07/10/20 195 lb (88.5 kg)  01/15/19 171 lb (77.6 kg)  01/15/19 171 lb 14.4 oz (78 kg)    Lab Results  Component  Value Date   WBC 5.3 01/15/2019   HGB 13.4 01/15/2019   HCT 40.5 01/15/2019   PLT 291 01/15/2019   GLUCOSE 100 (H) 01/15/2019   ALT 13 01/15/2019   AST 15 01/15/2019   NA 139 01/15/2019   K 3.8 01/15/2019   CL 108 01/15/2019   CREATININE 0.76 01/15/2019   BUN 11 01/15/2019   CO2 24 01/15/2019    Assessment/Plan:  Routine screening for STI (sexually transmitted infection)  -Questions answered to satisfaction -Pt obtained self swab done. Will also obtain blood work. -given handouts - Plan: Cervicovaginal ancillary only, HIV antibody (with reflex), RPR  Trichomoniasis -Given handout -Pt wishes to wait on repeat testing before starting treatment.  F/u prn  01/17/2019, MD

## 2020-07-10 NOTE — Patient Instructions (Signed)
Trichomoniasis Trichomoniasis is an STI (sexually transmitted infection) that can affect both women and men. In women, the outer area of the female genitalia (vulva) and the vagina are affected. In men, mainly the penis is affected, but the prostate and other reproductive organs can also be involved.  This condition can be treated with medicine. It often has no symptoms (is asymptomatic), especially in men. If not treated, trichomoniasis can last for months or years. What are the causes? This condition is caused by a parasite called Trichomonas vaginalis. Trichomoniasis most often spreads from person to person (is contagious) through sexual contact. What increases the risk? The following factors may make you more likely to develop this condition:  Having unprotected sex.  Having sex with a partner who has trichomoniasis.  Having multiple sexual partners.  Having had previous trichomoniasis infections or other STIs. What are the signs or symptoms? In women, symptoms of trichomoniasis include:  Abnormal vaginal discharge that is clear, white, gray, or yellow-green and foamy and has an unusual "fishy" odor.  Itching and irritation of the vagina and vulva.  Burning or pain during urination or sex.  Redness and swelling of the genitals. In men, symptoms of trichomoniasis include:  Penile discharge that may be foamy or contain pus.  Pain in the penis. This may happen only when urinating.  Itching or irritation inside the penis.  Burning after urination or ejaculation. How is this diagnosed? In women, this condition may be found during a routine Pap test or physical exam. It may be found in men during a routine physical exam. Your health care provider may do tests to help diagnose this infection, such as:  Urine tests (men and women).  The following in women: ? Testing the pH of the vagina. ? A vaginal swab test that checks for the Trichomonas vaginalis parasite. ? Testing vaginal  secretions. Your health care provider may test you for other STIs, including HIV (human immunodeficiency virus). How is this treated? This condition is treated with medicine taken by mouth (orally), such as metronidazole or tinidazole, to fight the infection. Your sexual partner(s) also need to be tested and treated.  If you are a woman and you plan to become pregnant or think you may be pregnant, tell your health care provider right away. Some medicines that are used to treat the infection should not be taken during pregnancy. Your health care provider may recommend over-the-counter medicines or creams to help relieve itching or irritation. You may be tested for infection again 3 months after treatment. Follow these instructions at home:  Take and use over-the-counter and prescription medicines, including creams, only as told by your health care provider.  Take your antibiotic medicine as told by your health care provider. Do not stop taking the antibiotic even if you start to feel better.  Do not have sex until 7-10 days after you finish your medicine, or until your health care provider approves. Ask your health care provider when you may start to have sex again.  (Women) Do not douche or wear tampons while you have the infection.  Discuss your infection with your sexual partner(s). Make sure that your partner gets tested and treated, if necessary.  Keep all follow-up visits as told by your health care provider. This is important. How is this prevented?   Use condoms every time you have sex. Using condoms correctly and consistently can help protect against STIs.  Avoid having multiple sexual partners.  Talk with your sexual partner about any   symptoms that either of you may have, as well as any history of STIs.  Get tested for STIs and STDs (sexually transmitted diseases) before you have sex. Ask your partner to do the same.  Do not have sexual contact if you have symptoms of  trichomoniasis or another STI. Contact a health care provider if:  You still have symptoms after you finish your medicine.  You develop pain in your abdomen.  You have pain when you urinate.  You have bleeding after sex.  You develop a rash.  You feel nauseous or you vomit.  You plan to become pregnant or think you may be pregnant. Summary  Trichomoniasis is an STI (sexually transmitted infection) that can affect both women and men.  This condition often has no symptoms (is asymptomatic), especially in men.  Without treatment, this condition can last for months or years.  You should not have sex until 7-10 days after you finish your medicine, or until your health care provider approves. Ask your health care provider when you may start to have sex again.  Discuss your infection with your sexual partner(s). Make sure that your partner gets tested and treated, if necessary. This information is not intended to replace advice given to you by your health care provider. Make sure you discuss any questions you have with your health care provider. Document Revised: 09/29/2018 Document Reviewed: 09/29/2018 Elsevier Patient Education  2020 [employer]  

## 2020-07-11 ENCOUNTER — Telehealth: Payer: Self-pay | Admitting: Family Medicine

## 2020-07-11 LAB — RPR: RPR Ser Ql: NONREACTIVE

## 2020-07-11 LAB — HIV ANTIBODY (ROUTINE TESTING W REFLEX): HIV 1&2 Ab, 4th Generation: NONREACTIVE

## 2020-07-11 NOTE — Telephone Encounter (Signed)
Pt is calling in stating that she received her lab results she is aware that the provider has not resulted them and someone will call her ones the provider has seen resulted them.

## 2020-07-12 LAB — CERVICOVAGINAL ANCILLARY ONLY
Bacterial Vaginitis (gardnerella): NEGATIVE
Candida Glabrata: NEGATIVE
Candida Vaginitis: NEGATIVE
Chlamydia: NEGATIVE
Comment: NEGATIVE
Comment: NEGATIVE
Comment: NEGATIVE
Comment: NEGATIVE
Comment: NEGATIVE
Comment: NORMAL
Neisseria Gonorrhea: NEGATIVE
Trichomonas: NEGATIVE

## 2020-07-14 NOTE — Telephone Encounter (Signed)
Patient notified of update  and verbalized understanding. 

## 2020-07-30 DIAGNOSIS — Z419 Encounter for procedure for purposes other than remedying health state, unspecified: Secondary | ICD-10-CM | POA: Diagnosis not present

## 2020-08-30 DIAGNOSIS — Z419 Encounter for procedure for purposes other than remedying health state, unspecified: Secondary | ICD-10-CM | POA: Diagnosis not present

## 2020-09-29 DIAGNOSIS — Z419 Encounter for procedure for purposes other than remedying health state, unspecified: Secondary | ICD-10-CM | POA: Diagnosis not present

## 2020-10-24 ENCOUNTER — Telehealth: Payer: Self-pay | Admitting: Family Medicine

## 2020-10-24 NOTE — Telephone Encounter (Signed)
Spoke with pt scheduled for an office visit on 10/25/2020 to discuss on options for Birth control

## 2020-10-24 NOTE — Telephone Encounter (Signed)
Pt call and want a call back about her depo and what to do .

## 2020-10-25 ENCOUNTER — Encounter: Payer: Self-pay | Admitting: Family Medicine

## 2020-10-25 ENCOUNTER — Ambulatory Visit (INDEPENDENT_AMBULATORY_CARE_PROVIDER_SITE_OTHER): Payer: BC Managed Care – PPO | Admitting: Family Medicine

## 2020-10-25 ENCOUNTER — Other Ambulatory Visit: Payer: Self-pay

## 2020-10-25 VITALS — BP 110/76 | HR 70 | Temp 98.5°F | Wt 197.4 lb

## 2020-10-25 DIAGNOSIS — N912 Amenorrhea, unspecified: Secondary | ICD-10-CM | POA: Diagnosis not present

## 2020-10-25 LAB — POCT URINE PREGNANCY: Preg Test, Ur: NEGATIVE

## 2020-10-25 NOTE — Progress Notes (Signed)
Subjective:    Patient ID: Tonya [name], female    DOB: April 01, 1986, 34 y.o.   MRN: [id number]  No chief complaint on file.   HPI Patient was seen today to discuss birth control options though followed by Marlow Baars, OB/GYN.  Pt concerned her period has not returned since being off Depo.  Depo was due in August, but she did not go to the appt.  Pt had recent f/u with OB/Gyn, advised to take PNV and primrose to see if menses would start.  Pt stopped primrose 2/2 it causing hot flashes.  Pt tried 6 different PNV but they causes nausea.  Pt is open to the idea of having more children.  In the past nuva ring caused spotting.  Past Medical History:  Diagnosis Date  . Asthma     Allergies  Allergen Reactions  . Shrimp [Shellfish Allergy] Shortness Of Breath and Itching  . Latex Itching and Other (See Comments)    Reaction:  Burning     ROS General: Denies fever, chills, night sweats, changes in weight, changes in appetite HEENT: Denies headaches, ear pain, changes in vision, rhinorrhea, sore throat CV: Denies CP, palpitations, SOB, orthopnea Pulm: Denies SOB, cough, wheezing GI: Denies abdominal pain, nausea, vomiting, diarrhea, constipation GU: Denies dysuria, hematuria, frequency, vaginal discharge  +absent menses Msk: Denies muscle cramps, joint pains Neuro: Denies weakness, numbness, tingling Skin: Denies rashes, bruising Psych: Denies depression, anxiety, hallucinations     Objective:    Blood pressure 110/76, pulse 70, temperature 98.5 F (36.9 C), temperature source Oral, weight 197 lb 6.4 oz (89.5 kg), last menstrual period 07/29/2020, SpO2 99 %.  Gen. Pleasant, well-nourished, in no distress, normal affect  HEENT: [place]/AT, face symmetric, conjunctiva clear, no scleral icterus, PERRLA, EOMI, nares patent without drainage Lungs: no accessory muscle use Cardiovascular: RRR, no peripheral edema Musculoskeletal: No deformities, no cyanosis or clubbing, normal tone Neuro:   A&Ox3, CN II-XII intact, normal gait Skin:  Warm, no lesions/ rash   Wt Readings from Last 3 Encounters:  07/10/20 195 lb (88.5 kg)  01/15/19 171 lb (77.6 kg)  01/15/19 171 lb 14.4 oz (78 kg)    Lab Results  Component Value Date   WBC 5.3 01/15/2019   HGB 13.4 01/15/2019   HCT 40.5 01/15/2019   PLT 291 01/15/2019   GLUCOSE 100 (H) 01/15/2019   ALT 13 01/15/2019   AST 15 01/15/2019   NA 139 01/15/2019   K 3.8 01/15/2019   CL 108 01/15/2019   CREATININE 0.76 01/15/2019   BUN 11 01/15/2019   CO2 24 01/15/2019    Assessment/Plan:  Absent menses  -discussed watchful waiting. -UHcg negative -continue PNV -continue f/u with OB/Gyn. - Plan: POCT urine pregnancy  F/u prn  Abbe Amsterdam, MD

## 2020-10-25 NOTE — Patient Instructions (Addendum)

## 2020-10-30 DIAGNOSIS — Z419 Encounter for procedure for purposes other than remedying health state, unspecified: Secondary | ICD-10-CM | POA: Diagnosis not present

## 2020-11-29 DIAGNOSIS — R7611 Nonspecific reaction to tuberculin skin test without active tuberculosis: Secondary | ICD-10-CM

## 2020-11-29 DIAGNOSIS — Z419 Encounter for procedure for purposes other than remedying health state, unspecified: Secondary | ICD-10-CM | POA: Diagnosis not present

## 2020-11-29 HISTORY — DX: Nonspecific reaction to tuberculin skin test without active tuberculosis: R76.11

## 2020-12-11 ENCOUNTER — Other Ambulatory Visit (HOSPITAL_COMMUNITY): Payer: Self-pay | Admitting: Occupational Medicine

## 2020-12-11 ENCOUNTER — Other Ambulatory Visit: Payer: Self-pay

## 2020-12-11 ENCOUNTER — Ambulatory Visit (HOSPITAL_COMMUNITY)
Admission: RE | Admit: 2020-12-11 | Discharge: 2020-12-11 | Disposition: A | Discharge status: Home / Self Care | Payer: BC Managed Care – PPO | Source: Ambulatory Visit | Attending: Occupational Medicine | Admitting: Occupational Medicine

## 2020-12-11 DIAGNOSIS — R7611 Nonspecific reaction to tuberculin skin test without active tuberculosis: Secondary | ICD-10-CM

## 2020-12-30 DIAGNOSIS — Z419 Encounter for procedure for purposes other than remedying health state, unspecified: Secondary | ICD-10-CM | POA: Diagnosis not present

## 2021-01-30 DIAGNOSIS — Z419 Encounter for procedure for purposes other than remedying health state, unspecified: Secondary | ICD-10-CM | POA: Diagnosis not present

## 2021-02-07 ENCOUNTER — Other Ambulatory Visit: Payer: Self-pay | Admitting: Obstetrics & Gynecology

## 2021-02-20 ENCOUNTER — Other Ambulatory Visit: Payer: Self-pay | Admitting: Family Medicine

## 2021-02-20 DIAGNOSIS — J3089 Other allergic rhinitis: Secondary | ICD-10-CM

## 2021-02-27 DIAGNOSIS — Z419 Encounter for procedure for purposes other than remedying health state, unspecified: Secondary | ICD-10-CM | POA: Diagnosis not present

## 2021-03-14 NOTE — H&P (Signed)
Tonya [name] is a 35 y.o. female, P: 3-1-2-4 presenting for permanent sterilization because of her desire to cease childbearing. She is currently using the [employer] for contraception  and has used the Depo Provera injection along with birth control pills (became pregnant with this method) in the past. Her menses is monthly x 6 days with pad change every 2 hours and cramping that she describes as intense. She gets some relief from Advil 200 mg stating  that she is not able to take much pain medication because "any" causes excess somnolence. She denies any irregular bleeding, pelvic pain, changes in bowel or bladder function or vaginitis symptoms. Though she is aware of the various methods of contraception she has decided to proceed with permanent sterilization.    Past Medical History  OB History: G: 5; P: 3-1-2-4: SVB: 2003, 2010 and (twins) 2016  GYN History: menarche: 35 YO;   LMP: 02/16/2021;    Contraception: Paediatric nurse;  Denies history of abnormal PAP smear.  Last PAP smear: normal 2021  Medical History: Asthma, Anxiety and Migraines  Surgical History: NONE Denies  history of blood transfusions  Family History: Hypertension and  Cancer (Breast, Prostate, Pharyngeal and Bone)  Social History: Married and employed with [employer]; Denies tobacco or alcohol  Medications: Escitalopram 10 mg daily NuvaRing monthly as directed Levocitirazine 5 mg  daily  Allergies  Allergen Reactions  . Shrimp [Shellfish Allergy] Shortness Of Breath and Itching  . Latex Itching and Other (See Comments)    Reaction:  Burning     Denies sensitivity to peanuts,  soy,  or adhesives.   ROS: Admits to occasional migraines but  denies corrective lenses,  vision changes, nasal congestion, dysphagia, tinnitus, dizziness, hoarseness, cough,  chest pain, shortness of breath, nausea, vomiting, diarrhea,constipation,  urinary frequency, urgency  dysuria, hematuria, vaginitis symptoms, pelvic pain,  swelling of joints,easy bruising,  myalgias, arthralgias, skin rashes, unexplained weight loss and except as is mentioned in the history of present illness, patient's review of systems is otherwise negative.    Physical Exam  Bp: 110/66;  P: 67 bpm;  R: 16;  Weight: 204lbs.  Height: 5\' 3" ; Temperature: 98.5 degrees F orally  Neck: supple without masses or thyromegaly Lungs: clear to auscultation Heart: regular rate and rhythm Abdomen: soft, non-tender and no organomegaly Pelvic:EGBUS- wnl; vagina-normal rugae; uterus-normal size, cervix without lesions or motion tenderness; adnexae-no tenderness or masses Extremities:  no clubbing, cyanosis or edema   Assesment: Desire for Sterilization   Disposition:  A discussion was held with patient regarding the indication for her procedure(s) along with the risks, which include but are not limited to: reaction to anesthesia, damage to adjacent organs (bowel, bladder, ureters)  infection and excessive bleeding. The patient verbalized understanding of these risks and has consented to proceed with Laparoscopic Bilateral Salpingectomy at [facility] on March 27, 2021 @ 12: 45 p.m.   CSN# March 29, 2021   [place] J. [id number], PA-C  for Dr. Lowell Guitar. [place]

## 2021-03-20 ENCOUNTER — Other Ambulatory Visit: Payer: Self-pay

## 2021-03-20 ENCOUNTER — Encounter: Payer: Self-pay | Admitting: Family Medicine

## 2021-03-21 ENCOUNTER — Encounter: Payer: Self-pay | Admitting: Family Medicine

## 2021-03-21 ENCOUNTER — Ambulatory Visit (INDEPENDENT_AMBULATORY_CARE_PROVIDER_SITE_OTHER): Payer: BC Managed Care – PPO | Admitting: Family Medicine

## 2021-03-21 ENCOUNTER — Ambulatory Visit (INDEPENDENT_AMBULATORY_CARE_PROVIDER_SITE_OTHER): Payer: BC Managed Care – PPO

## 2021-03-21 VITALS — BP 138/90 | HR 71 | Temp 98.6°F

## 2021-03-21 DIAGNOSIS — M5417 Radiculopathy, lumbosacral region: Secondary | ICD-10-CM

## 2021-03-21 DIAGNOSIS — R030 Elevated blood-pressure reading, without diagnosis of hypertension: Secondary | ICD-10-CM

## 2021-03-21 NOTE — Patient Instructions (Signed)
Preventing Hypertension Hypertension, also called high blood pressure, is when the force of blood pumping through the arteries is too strong. Arteries are blood vessels that carry blood from the heart throughout the body. Often, hypertension does not cause symptoms until blood pressure is very high. It is important to have your blood pressure checked regularly. Diet and lifestyle changes can help you prevent hypertension, and they may make you feel better overall and improve your quality of life. If you already have hypertension, you may control it with diet and lifestyle changes, as well as with medicine. How can this condition affect me? Over time, hypertension can damage the arteries and decrease blood flow to important parts of the body, including the brain, heart, and kidneys. By keeping your blood pressure in a healthy range, you can help prevent complications like heart attack, heart failure, stroke, kidney failure, and vascular dementia. What can increase my risk?  Being an older adult. Older people are more often affected.  Having family members who have had high blood pressure.  Being obese.  Being female. Males are more likely to have high blood pressure.  Drinking too much alcohol or caffeine.  Smoking or using illegal drugs.  Taking certain medicines, such as antidepressants, decongestants, birth control pills, and NSAIDs, such as ibuprofen.  Having thyroid problems.  Having certain tumors. What actions can I take to prevent or manage this condition? Work with your health care provider to make a hypertension prevention plan that works for you. Follow your plan and keep all follow-up visits as told by your health care provider. Diet changes Maintain a healthy diet. This includes:  Eating less salt (sodium). Ask your health care provider how much sodium is safe for you to have. The general recommendation is to have less than 1 tsp (2,300 mg) of sodium a day. ? Do not add salt  to your food. ? Choose low-sodium options when grocery shopping and eating out.  Limiting fats in your diet. You can do this by eating low-fat or fat-free dairy products and by eating less red meat.  Eating more fruits, vegetables, and whole grains. Make a goal to eat: ? 1-2 cups of fresh fruits and vegetables each day. ? 3-4 servings of whole grains each day.  Avoiding foods and beverages that have added sugars.  Eating fish that contain healthy fats (omega-3 fatty acids), such as mackerel or salmon. If you need help putting together a healthy eating plan, try the DASH diet. This diet is high in fruits, vegetables, and whole grains. It is low in sodium, red meat, and added sugars. DASH stands for Dietary Approaches to Stop Hypertension.   Lifestyle changes Lose weight if you are overweight. Losing just 3?5% of your body weight can help prevent or control hypertension. For example, if your present weight is 200 lb (91 kg), a loss of 3-5% of your weight means losing 6-10 lb (2.7-4.5 kg). Ask your health care provider to help you with a diet and exercise plan to safely lose weight. Other recommendations usually include:  Get enough exercise. Do at least 150 minutes of moderate-intensity exercise each week. You could do this in short exercise sessions several times a day, or you could do longer exercise sessions a few times a week. For example, you could take a brisk 10-minute walk or bike ride, 3 times a day, for 5 days a week.  Find ways to reduce stress, such as exercising, meditating, listening to music, or taking a yoga class.  If you need help reducing stress, ask your health care provider.  Do not use any products that contain nicotine or tobacco, such as cigarettes, e-cigarettes, and chewing tobacco. If you need help quitting, ask your health care provider. Chemicals in tobacco and nicotine products raise your blood pressure each time you use them. If you need help quitting, ask your health  care provider.  Learn how to check your blood pressure at home. Make sure that you know your personal target blood pressure, as told by your health care provider.  Try to sleep 7-9 hours per night.   Alcohol use  Do not drink alcohol if: ? Your health care provider tells you not to drink. ? You are pregnant, may be pregnant, or are planning to become pregnant.  If you drink alcohol: ? Limit how much you use to:  0-1 drink a day for women.  0-2 drinks a day for men. ? Be aware of how much alcohol is in your drink. In the [place] one drink equals one 12 oz bottle of beer (355 mL), one 5 oz glass of wine (148 mL), or one 1 oz glass of hard liquor (44 mL). Medicines In addition to diet and lifestyle changes, your health care provider may recommend medicines to help lower your blood pressure. In general:  You may need to try a few different medicines to find what works best for you.  You may need to take more than one medicine.  Take over-the-counter and prescription medicines only as told by your health care provider. Questions to ask your health care provider  What is my blood pressure goal?  How can I lower my risk for high blood pressure?  How should I monitor my blood pressure at home? Where to find support Your health care provider can help you prevent hypertension and help you keep your blood pressure at a healthy level. Your local hospital or your community may also provide support services and prevention programs. The American Heart Association offers an online support network at supportnetwork.heart.org Where to find more information Learn more about hypertension from:  National Heart, Lung, and Blood Institute: www.nhlbi.nih.gov  Centers for Disease Control and Prevention: www.cdc.gov  American Academy of Family Physicians: familydoctor.org Learn more about the DASH diet from:  National Heart, Lung, and Blood Institute: www.nhlbi.nih.gov Contact a health care  provider if:  You think you are having a reaction to medicines you have taken.  You have recurrent headaches or feel dizzy.  You have swelling in your ankles.  You have trouble with your vision. Get help right away if:  You have sudden, severe chest, back, or abdominal pain or discomfort.  You have shortness of breath.  You have a sudden, severe headache. These symptoms may represent a serious problem that is an emergency. Do not wait to see if the symptoms will go away. Get medical help right away. Call your local emergency services (911 in the [place] Do not drive yourself to the hospital.  Summary  Hypertension often does not cause any symptoms until blood pressure is very high. It is important to get your blood pressure checked regularly.  Diet and lifestyle changes are important steps in preventing hypertension.  By keeping your blood pressure in a healthy range, you may prevent complications like heart attack, heart failure, stroke, and kidney failure.  Work with your health care provider to make a hypertension prevention plan that works for you. This information is not intended to replace advice given to   you by your health care provider. Make sure you discuss any questions you have with your health care provider. Document Revised: 11/16/2019 Document Reviewed: 11/16/2019 Elsevier Patient Education  2021 Elsevier Inc.  How to Take Your Blood Pressure Blood pressure measures how strongly your blood is pressing against the walls of your arteries. Arteries are blood vessels that carry blood from your heart throughout your body. You can take your blood pressure at home with a machine. You may need to check your blood pressure at home:  To check if you have high blood pressure (hypertension).  To check your blood pressure over time.  To make sure your blood pressure medicine is working. Supplies needed:  Blood pressure machine, or monitor.  Dining room chair to sit  in.  Table or desk.  Small notebook.  Pencil or pen. How to prepare Avoid these things for 30 minutes before checking your blood pressure:  Having drinks with caffeine in them, such as coffee or tea.  Drinking alcohol.  Eating.  Smoking.  Exercising. Do these things five minutes before checking your blood pressure:  Go to the bathroom and pee (urinate).  Sit in a dining chair. Do not sit in a soft couch or an armchair.  Be quiet. Do not talk. How to take your blood pressure Follow the instructions that came with your machine. If you have a digital blood pressure monitor, these may be the instructions: 1. Sit up straight. 2. Place your feet on the floor. Do not cross your ankles or legs. 3. Rest your left arm at the level of your heart. You may rest it on a table, desk, or chair. 4. Pull up your shirt sleeve. 5. Wrap the blood pressure cuff around the upper part of your left arm. The cuff should be 1 inch (2.5 cm) above your elbow. It is best to wrap the cuff around bare skin. 6. Fit the cuff snugly around your arm. You should be able to place only one finger between the cuff and your arm. 7. Place the cord so that it rests in the bend of your elbow. 8. Press the power button. 9. Sit quietly while the cuff fills with air and loses air. 10. Write down the numbers on the screen. 11. Wait 2-3 minutes and then repeat steps 1-10.   What do the numbers mean? Two numbers make up your blood pressure. The first number is called systolic pressure. The second is called diastolic pressure. An example of a blood pressure reading is "120 over 80" (or 120/80). If you are an adult and do not have a medical condition, use this guide to find out if your blood pressure is normal: Normal  First number: below 120.  Second number: below 80. Elevated  First number: 120-129.  Second number: below 80. Hypertension stage 1  First number: 130-139.  Second number: 80-89. Hypertension  stage 2  First number: 140 or above.  Second number: 90 or above. Your blood pressure is above normal even if only the top or bottom number is above normal. Follow these instructions at home:  Check your blood pressure as often as your doctor tells you to.  Check your blood pressure at the same time every day.  Take your monitor to your next doctor's appointment. Your doctor will: ? Make sure you are using it correctly. ? Make sure it is working right.  Make sure you understand what your blood pressure numbers should be.  Tell your doctor if your medicine  is causing side effects.  Keep all follow-up visits as told by your doctor. This is important. General tips:  You will need a blood pressure machine, or monitor. Your doctor can suggest a monitor. You can buy one at a drugstore or online. When choosing one: ? Choose one with an arm cuff. ? Choose one that wraps around your upper arm. Only one finger should fit between your arm and the cuff. ? Do not choose one that measures your blood pressure from your wrist or finger. Where to find more information American Heart Association: www.heart.org Contact a doctor if:  Your blood pressure keeps being high. Get help right away if:  Your first blood pressure number is higher than 180.  Your second blood pressure number is higher than 120. Summary  Check your blood pressure at the same time every day.  Avoid caffeine, alcohol, smoking, and exercise for 30 minutes before checking your blood pressure.  Make sure you understand what your blood pressure numbers should be. This information is not intended to replace advice given to you by your health care provider. Make sure you discuss any questions you have with your health care provider. Document Revised: 12/10/2019 Document Reviewed: 12/10/2019 Elsevier Patient Education  2021 Elsevier Inc.  Lumbosacral Radiculopathy Lumbosacral radiculopathy is a condition that involves the  spinal nerves and nerve roots in the low back and bottom of the spine. The condition develops when these nerves and nerve roots move out of place or become inflamed and cause symptoms. What are the causes? This condition may be caused by:  Pressure from a disk that bulges out of place (herniated disk). A disk is a plate of soft cartilage that separates bones in the spine.  Disk changes that occur with age (disk degeneration).  A narrowing of the bones of the lower back (spinal stenosis).  A tumor.  An infection.  An injury that places sudden pressure on the disks that cushion the bones of your lower spine. What increases the risk? You are more likely to develop this condition if:  You are a female who is 21-60 years old.  You are a female who is 60-73 years old.  You use improper technique when lifting things.  You are overweight or live a sedentary lifestyle.  You smoke.  Your work requires frequent lifting.  You do repetitive activities that strain the spine. What are the signs or symptoms? Symptoms of this condition include:  Pain that goes down from your back into your legs (sciatica), usually on one side of the body. This is the most common symptom. The pain may be worse with sitting, coughing, or sneezing.  Pain and numbness in your legs.  Muscle weakness.  Tingling.  Loss of bladder control or bowel control.   How is this diagnosed? This condition may be diagnosed based on:  Your symptoms and medical history.  A physical exam. If the pain is lasting, you may have tests, such as:  MRI scan.  X-ray.  CT scan.  A type of X-ray used to examine the spinal canal after injecting a dye into your spine (myelogram).  A test to measure how electrical impulses move through a nerve (nerve conduction study). How is this treated? Treatment may depend on the cause of the condition and may include:  Working with a physical therapist.  Taking pain  medicine.  Applying heat and ice to affected areas.  Doing stretches to improve flexibility.  Doing exercises to strengthen back muscles.  Having chiropractic spinal manipulation.  Using transcutaneous electrical nerve stimulation (TENS) therapy.  Getting a steroid injection in the spine. In some cases, no treatment is needed. If the condition is long-lasting (chronic), or if symptoms are severe, treatment may involve surgery or lifestyle changes, such as following a weight-loss plan. Follow these instructions at home: Activity  Avoid bending and other activities that make the problem worse.  Maintain a proper position when standing or sitting: ? When standing, keep your upper back and neck straight, with your shoulders pulled back. Avoid slouching. ? When sitting, keep your back straight and relax your shoulders. Do not round your shoulders or pull them backward.  Do not sit or stand in one place for long periods of time.  Take brief periods of rest throughout the day. This will reduce your pain. It is usually better to rest by lying down or standing, not sitting.  When you are resting for longer periods, mix in some mild activity or stretching between periods of rest. This will help to prevent stiffness and pain.  Get regular exercise. Ask your health care provider what activities are safe for you. If you were shown how to do any exercises or stretches, do them as directed by your health care provider.  Do not lift anything that is heavier than 10 lb (4.5 kg) or the limit that you are told by your health care provider. Always use proper lifting technique, which includes: ? Bending your knees. ? Keeping the load close to your body. ? Avoiding twisting. Managing pain  If directed, put ice on the affected area: ? Put ice in a plastic bag. ? Place a towel between your skin and the bag. ? Leave the ice on for 20 minutes, 2-3 times a day.  If directed, apply heat to the affected  area as often as told by your health care provider. Use the heat source that your health care provider recommends, such as a moist heat pack or a heating pad. ? Place a towel between your skin and the heat source. ? Leave the heat on for 20-30 minutes. ? Remove the heat if your skin turns bright red. This is especially important if you are unable to feel pain, heat, or cold. You may have a greater risk of getting burned.  Take over-the-counter and prescription medicines only as told by your health care provider. General instructions  Sleep on a firm mattress in a comfortable position. Try lying on your side with your knees slightly bent. If you lie on your back, put a pillow under your knees.  Do not drive or use heavy machinery while taking prescription pain medicine.  If your health care provider prescribed a diet or exercise program, follow it as directed.  Keep all follow-up visits as told by your health care provider. This is important. Contact a health care provider if:  Your pain does not improve over time, even when taking pain medicines. Get help right away if:  You develop severe pain.  Your pain suddenly gets worse.  You develop increasing weakness in your legs.  You lose the ability to control your bladder or bowel.  You have difficulty walking or balancing.  You have a fever. Summary  Lumbosacral radiculopathy is a condition that occurs when the spinal nerves and nerve roots in the lower part of the spine move out of place or become inflamed and cause symptoms.  Symptoms include pain, numbness, and tingling that go down from your back into  your legs (sciatica), muscle weakness, and loss of bladder control or bowel control.  If directed, apply ice or heat to the affected area as told by your health care provider.  Follow instructions about activity, rest, and proper lifting technique. This information is not intended to replace advice given to you by your health  care provider. Make sure you discuss any questions you have with your health care provider. Document Revised: 10/26/2020 Document Reviewed: 10/26/2020 Elsevier Patient Education  2021 [employer].

## 2021-03-21 NOTE — Progress Notes (Signed)
Subjective:    Patient ID: Tonya [name], female    DOB: July 10, 1986, 35 y.o.   MRN: [id number]  No chief complaint on file.   HPI Patient was seen today for f/u.  Pt endorses h/o low back pain with "pinched nerve".  Pt notes at times her back pain will cause pain to go down her legs/cause loss of bladder.  Pt seen by Chiropractor.  Rainy/cold weather makes back feel worse.  In the past pt's old boss suggested she use a stand up desk, which helped.  Pt's job is now requiring a letter of necessity for her to keep the stand up desk.  Pt is in a new position at work that has caused increased stress.    Pt's oldest son graduates HS this yr. Pt having surgery later this wk for BTL.  Past Medical History:  Diagnosis Date  . Asthma     Allergies  Allergen Reactions  . Shrimp [Shellfish Allergy] Shortness Of Breath and Itching  . Latex Itching and Other (See Comments)    Reaction:  Burning     ROS General: Denies fever, chills, night sweats, changes in weight, changes in appetite HEENT: Denies headaches, ear pain, changes in vision, rhinorrhea, sore throat CV: Denies CP, palpitations, SOB, orthopnea Pulm: Denies SOB, cough, wheezing GI: Denies abdominal pain, nausea, vomiting, diarrhea, constipation GU: Denies dysuria, hematuria, frequency, vaginal discharge Msk: Denies muscle cramps, joint pains +h/o low back pain with radiation Neuro: Denies weakness, numbness, tingling Skin: Denies rashes, bruising Psych: Denies depression, anxiety, hallucinations     Objective:    Blood pressure 138/90, pulse 71, temperature 98.6 F (37 C), temperature source Oral, SpO2 97 %.  Gen. Pleasant, well-nourished, in no distress, normal affect   HEENT: [place]/AT, face symmetric, conjunctiva clear, no scleral icterus, PERRLA, EOMI, nares patent without drainage Lungs: no accessory muscle use, CTAB, no wheezes or rales Cardiovascular: RRR, no m/r/g, no peripheral edema Musculoskeletal: No deformities, no  cyanosis or clubbing, normal tone Neuro:  A&Ox3, CN II-XII intact, normal gait Skin:  Warm, no lesions/ rash   Wt Readings from Last 3 Encounters:  10/25/20 197 lb 6.4 oz (89.5 kg)  07/10/20 195 lb (88.5 kg)  01/15/19 171 lb (77.6 kg)    Lab Results  Component Value Date   WBC 5.3 01/15/2019   HGB 13.4 01/15/2019   HCT 40.5 01/15/2019   PLT 291 01/15/2019   GLUCOSE 100 (H) 01/15/2019   ALT 13 01/15/2019   AST 15 01/15/2019   NA 139 01/15/2019   K 3.8 01/15/2019   CL 108 01/15/2019   CREATININE 0.76 01/15/2019   BUN 11 01/15/2019   CO2 24 01/15/2019    Assessment/Plan:  Lumbosacral radiculopathy -given note for work for continued use of stand up desk -given h/o loss of bladder imaging advised.  Will obtain DG lumbar spine, though would likely benefit from MRI. -continue f/u with chiropractor.  - Plan: DG Lumbar Spine Complete  Elevated blood pressure reading without diagnosis of hypertension -given precautions -advised to check bp at home -lifestyle modifications -self care advised. -f/u in 1 month for bp recheck.  F/u in 1 month  Abbe Amsterdam, MD

## 2021-03-22 ENCOUNTER — Other Ambulatory Visit: Payer: Self-pay

## 2021-03-22 ENCOUNTER — Encounter (HOSPITAL_BASED_OUTPATIENT_CLINIC_OR_DEPARTMENT_OTHER): Payer: Self-pay | Admitting: Obstetrics & Gynecology

## 2021-03-22 NOTE — Progress Notes (Signed)
Spoke w/ via phone for pre-op interview---pt Lab needs dos---- cbc bmp urine preg              Lab results------none COVID test ------03-23-2021 230 pm Arrive at -------1100 am 03-27-2021 NPO after MN NO Solid Food.  Clear liquids from MN until---1000 am then npo Med rec completed Medications to take morning of surgery -----advair inhaler prn/bring inhaler Diabetic medication -----n/a Patient instructed to bring photo id and insurance card day of surgery Patient aware to have Driver (ride ) / caregiver  Spouse jacque will stay   for 24 hours after surgery  Patient Special Instructions -----none Pre-Op special Istructions -----none Patient verbalized understanding of instructions that were given at this phone interview. Patient denies shortness of breath, chest pain, fever, cough at this phone interview.

## 2021-03-23 ENCOUNTER — Other Ambulatory Visit (HOSPITAL_COMMUNITY)
Admission: RE | Admit: 2021-03-23 | Discharge: 2021-03-23 | Disposition: A | Discharge status: Home / Self Care | Payer: BC Managed Care – PPO | Source: Ambulatory Visit | Attending: Obstetrics & Gynecology | Admitting: Obstetrics & Gynecology

## 2021-03-23 DIAGNOSIS — Z20822 Contact with and (suspected) exposure to covid-19: Secondary | ICD-10-CM | POA: Diagnosis not present

## 2021-03-23 DIAGNOSIS — Z01812 Encounter for preprocedural laboratory examination: Secondary | ICD-10-CM | POA: Diagnosis present

## 2021-03-23 LAB — SARS CORONAVIRUS 2 (TAT 6-24 HRS): SARS Coronavirus 2: NEGATIVE

## 2021-03-27 ENCOUNTER — Encounter (HOSPITAL_COMMUNITY): Payer: Self-pay | Admitting: Anesthesiology

## 2021-03-27 ENCOUNTER — Ambulatory Visit (HOSPITAL_BASED_OUTPATIENT_CLINIC_OR_DEPARTMENT_OTHER)
Admission: RE | Admit: 2021-03-27 | Payer: BC Managed Care – PPO | Source: Home / Self Care | Admitting: Obstetrics & Gynecology

## 2021-03-27 HISTORY — DX: Elevated blood-pressure reading, without diagnosis of hypertension: R03.0

## 2021-03-27 HISTORY — DX: Anxiety disorder, unspecified: F41.9

## 2021-03-27 HISTORY — DX: Depression, unspecified: F32.A

## 2021-03-27 HISTORY — DX: Unspecified osteoarthritis, unspecified site: M19.90

## 2021-03-27 HISTORY — DX: Migraine, unspecified, not intractable, without status migrainosus: G43.909

## 2021-03-27 SURGERY — SALPINGECTOMY, BILATERAL, LAPAROSCOPIC
Anesthesia: Choice | Laterality: Bilateral

## 2021-03-27 NOTE — Anesthesia Preprocedure Evaluation (Deleted)
Anesthesia Evaluation    Reviewed: Allergy & Precautions, Patient's Chart, lab work & pertinent test results  Airway        Dental   Pulmonary asthma ,           Cardiovascular hypertension,      Neuro/Psych  Headaches, PSYCHIATRIC DISORDERS Anxiety Depression    GI/Hepatic negative GI ROS, Neg liver ROS,   Endo/Other  Obesity BMI 36  Renal/GU negative Renal ROS  negative genitourinary   Musculoskeletal  (+) Arthritis , Osteoarthritis,    Abdominal (+) + obese,   Peds  Hematology negative hematology ROS (+)   Anesthesia Other Findings   Reproductive/Obstetrics Desires sterility                              Anesthesia Physical Anesthesia Plan  ASA: II  Anesthesia Plan: General   Post-op Pain Management:    Induction: Intravenous  PONV Risk Score and Plan: 4 or greater and Ondansetron, Dexamethasone, Midazolam, Scopolamine patch - Pre-op and Treatment may vary due to age or medical condition  Airway Management Planned: Oral ETT  Additional Equipment: None  Intra-op Plan:   Post-operative Plan: Extubation in OR  Informed Consent:   Plan Discussed with:   Anesthesia Plan Comments:         Anesthesia Quick Evaluation

## 2021-03-30 DIAGNOSIS — Z419 Encounter for procedure for purposes other than remedying health state, unspecified: Secondary | ICD-10-CM | POA: Diagnosis not present

## 2021-04-23 ENCOUNTER — Ambulatory Visit: Payer: BC Managed Care – PPO | Admitting: Family Medicine

## 2021-04-23 DIAGNOSIS — Z0289 Encounter for other administrative examinations: Secondary | ICD-10-CM

## 2021-04-26 ENCOUNTER — Other Ambulatory Visit: Payer: Self-pay | Admitting: Family Medicine

## 2021-04-26 DIAGNOSIS — J3089 Other allergic rhinitis: Secondary | ICD-10-CM

## 2021-04-29 DIAGNOSIS — Z419 Encounter for procedure for purposes other than remedying health state, unspecified: Secondary | ICD-10-CM | POA: Diagnosis not present

## 2021-05-17 ENCOUNTER — Other Ambulatory Visit: Payer: Self-pay | Admitting: Family Medicine

## 2021-05-17 ENCOUNTER — Encounter: Payer: Self-pay | Admitting: Family Medicine

## 2021-05-17 DIAGNOSIS — F419 Anxiety disorder, unspecified: Secondary | ICD-10-CM

## 2021-05-17 MED ORDER — ESCITALOPRAM OXALATE 10 MG PO TABS: 10.0000 mg | ORAL_TABLET | Freq: Every day | ORAL | 1 refills | Status: DC

## 2021-05-30 DIAGNOSIS — Z419 Encounter for procedure for purposes other than remedying health state, unspecified: Secondary | ICD-10-CM | POA: Diagnosis not present

## 2021-06-29 DIAGNOSIS — Z419 Encounter for procedure for purposes other than remedying health state, unspecified: Secondary | ICD-10-CM | POA: Diagnosis not present

## 2021-07-30 DIAGNOSIS — Z419 Encounter for procedure for purposes other than remedying health state, unspecified: Secondary | ICD-10-CM | POA: Diagnosis not present

## 2021-08-16 ENCOUNTER — Telehealth: Payer: BC Managed Care – PPO | Admitting: Physician Assistant

## 2021-08-16 DIAGNOSIS — K1120 Sialoadenitis, unspecified: Secondary | ICD-10-CM

## 2021-08-16 MED ORDER — LIDOCAINE VISCOUS HCL 2 % MT SOLN: 15.0000 mL | OROMUCOSAL | 0 refills | Status: DC | PRN

## 2021-08-16 MED ORDER — AMOXICILLIN 500 MG PO CAPS
500.0000 mg | ORAL_CAPSULE | Freq: Three times a day (TID) | ORAL | 0 refills | Status: AC
Start: 2021-08-16 — End: 2021-08-26

## 2021-08-16 NOTE — Patient Instructions (Signed)
Tonya [name], thank you for joining Mar Daring, PA-C for today's virtual visit.  While this provider is not your primary care provider (PCP), if your PCP is located in our provider database this encounter information will be shared with them immediately following your visit.  Consent: (Patient) Tonya [name] provided verbal consent for this virtual visit at the beginning of the encounter.  Current Medications:  Current Outpatient Medications:    amoxicillin (AMOXIL) 500 MG capsule, Take 1 capsule (500 mg total) by mouth 3 (three) times daily for 10 days., Disp: 30 capsule, Rfl: 0   lidocaine (XYLOCAINE) 2 % solution, Use as directed 15 mLs in the mouth or throat every 4 (four) hours as needed for mouth pain., Disp: 200 mL, Rfl: 0   benzonatate (TESSALON PERLES) 100 MG capsule, Take 1 capsule (100 mg total) by mouth 2 (two) times daily as needed., Disp: 20 capsule, Rfl: 0   ELDERBERRY PO, Take by mouth daily. Elderberry syrup 1 teaspoon qd, Disp: , Rfl:    escitalopram (LEXAPRO) 10 MG tablet, Take 1 tablet (10 mg total) by mouth at bedtime., Disp: 90 tablet, Rfl: 1   etonogestrel-ethinyl estradiol (NUVARING) 0.12-0.015 MG/24HR vaginal ring, Place 1 each vaginally every 28 (twenty-eight) days. Insert vaginally and leave in place for 3 consecutive weeks, then remove for 1 week., Disp: , Rfl:    Fluticasone-Salmeterol (ADVAIR) 250-50 MCG/DOSE AEPB, Inhale 2 puffs into the lungs daily as needed (for shortness of breath). , Disp: , Rfl:    Ibuprofen (ADVIL MIGRAINE) 200 MG CAPS, Take 1 capsule by mouth daily as needed (headache)., Disp: , Rfl:    levocetirizine (XYZAL) 5 MG tablet, TAKE 1 TABLET BY MOUTH EVERY DAY IN THE EVENING, Disp: 30 tablet, Rfl: 2   Multiple Vitamins-Minerals (HAIR SKIN NAILS PO), Take 2 tablets by mouth daily., Disp: , Rfl:    Multiple Vitamins-Minerals (ZINC PO), Take 1 tablet by mouth every evening., Disp: , Rfl:    Medications ordered in this encounter:  Meds  ordered this encounter  Medications   amoxicillin (AMOXIL) 500 MG capsule    Sig: Take 1 capsule (500 mg total) by mouth 3 (three) times daily for 10 days.    Dispense:  30 capsule    Refill:  0    Order Specific Question:   Supervising Provider    Answer:   MILLER, BRIAN [[id number]]   lidocaine (XYLOCAINE) 2 % solution    Sig: Use as directed 15 mLs in the mouth or throat every 4 (four) hours as needed for mouth pain.    Dispense:  200 mL    Refill:  0    Order Specific Question:   Supervising Provider    Answer:   Sabra Heck, BRIAN [[id number]]     *If you need refills on other medications prior to your next appointment, please contact your pharmacy*  Follow-Up: Call back or seek an in-person evaluation if the symptoms worsen or if the condition fails to improve as anticipated.  Other Instructions Salivary Gland Infection  A salivary gland infection is an infection in one or more of the glands that produce saliva. You have six major salivary glands. Each gland has a duct that carries saliva into your mouth. Saliva keeps your mouth moist and breaks downthe food that you eat. It also helps prevent tooth decay. Two salivary glands are located just in front of your ears (parotid). The ducts for these glands open up inside your cheeks, near your back teeth. You  also have two glands under your tongue (sublingual) and two glands under your jaw (submandibular). The ducts for these glands open under your tongue. Any salivary gland can become infected. Most infections occur in the parotid glands or submandibularglands. What are the causes? This condition may be caused by bacteria or viruses. The bacteria that cause salivary gland infections are usually the same bacteria that normally live in your mouth. A stone can form in a salivary gland and block the flow of saliva. As a result, saliva backs up into the salivary gland. Bacteria may then start to grow behind the blockage and cause infection. Bacterial  infections usually cause pain and swelling on one side of the face. Submandibular gland swelling occurs under the jaw. Parotid swelling occurs in front of the ear. Bacterial infections are more common in adults. The mumps virus is the most common cause of viral salivary gland infections. However, mumps is now rare because of vaccination. This infection causes swelling in both parotid glands. Viral infections are more common in children. What increases the risk? The following factors may make you more likely to develop a bacterial infection: Not taking good care of your mouth and teeth (poor oral hygiene). Smoking. Not drinking enough water. Having a disease that causes dry mouth and dry eyes (Mikulicz syndrome or Sjogren syndrome). A viral infection is more likely to occur in children who do not get the MMR (measles, mumps, rubella) vaccine. What are the signs or symptoms? The main sign of a salivary gland infection is a swollen salivary gland. This type of inflammation is often called sialadenitis. You may have swelling in front of your ear, under your jaw, or under your tongue. Swelling may get worse when you eat and decrease after you eat. Other signs and symptoms include: Pain. Tenderness. Redness. Dry mouth. Bad taste in your mouth. Difficulty chewing and swallowing. Fever. How is this diagnosed? This condition may be diagnosed based on: Your signs and symptoms. A physical exam. During the exam, your health care provider will look and feel inside your mouth to see whether a stone is blocking a salivary gland duct. Tests, such as: An X-ray to check for a stone. An ultrasound, CT scan, or MRI to look for an abscess and to rule out other causes of swelling. A culture and sensitivity test. In this test, a sample of pus is taken from the salivary gland with a swab or by using a needle (aspiration). The sample is tested in a lab to determine the type of bacteria that is growing and which  antibiotic medicines will work against it. You may need to see an ear, nose, and throat specialist (ENT or otolaryngologist) for diagnosis and treatment. How is this treated? Viral salivary gland infections usually clear up without treatment. Bacterial infections are usually treated with antibiotic medicine. Severe infections that cause difficulty with swallowing may be treated with an IV antibiotic in thehospital. Other treatments may include: Probing and widening the salivary duct to allow a stone to pass. In some cases, a thin, flexible scope (endoscope) may be inserted into the duct to find a stone and remove it. Breaking up a stone using sound waves. Draining an infected gland (abscess) with a needle. Surgery to: Remove a stone. Drain pus from an abscess. Remove a badly infected gland. Follow these instructions at home:  Medicines Take over-the-counter and prescription medicines only as told by your health care provider. If you were prescribed an antibiotic medicine, take it as told by  your health care provider. Do not stop taking the antibiotic even if you start to feel better. To relieve discomfort Follow these instructions every few hours: Suck on a lemon candy to stimulate the flow of saliva. Put a warm compress over the gland. Gently massage the gland. Rinse your mouth with a salt-water mixture 3-4 times a day or as needed. To make a salt-water mixture, completely dissolve -1 tsp of salt in 1 cup of warm water. General instructions Practice good oral hygiene by brushing and flossing your teeth after meals and before you go to bed. Drink enough fluid to keep your urine pale yellow. Do not use any products that contain nicotine or tobacco, such as cigarettes and e-cigarettes. If you need help quitting, ask your health care provider. Keep all follow-up visits as told by your health care provider. This is important. Contact a health care provider if: You have pain and swelling in  your face, jaw, or mouth after eating. You have persistent swelling in any of these places: In front of your ear. Under your jaw. Inside your mouth. Get help right away if: You have pain and swelling in your face, jaw, or mouth, and this is getting worse. Your pain and swelling make it hard to swallow or breathe. Summary A salivary gland infection is an infection in one or more of the glands that produce saliva. You have six major salivary glands. Each gland has a duct that carries saliva into your mouth. Any salivary gland can become infected. Most infections occur in the glands just in front of your ears (parotid glands) or the glands under your jaw (submandibular glands). This condition may be caused by bacteria or viruses. Salivary gland infections caused by a virus usually clear up without treatment. Bacterial infections are usually treated with antibiotic medicine. This information is not intended to replace advice given to you by your health care provider. Make sure you discuss any questions you have with your healthcare provider. Document Revised: 01/14/2018 Document Reviewed: 01/14/2018 Elsevier Patient Education  2022 [employer].    If you have been instructed to have an in-person evaluation today at a local Urgent Care facility, please use the link below. It will take you to a list of all of our available [facility], including address, phone number and hours of operation. Please do not delay care.  [facility]  If you or a family member do not have a primary care provider, use the link below to schedule a visit and establish care. When you choose a [facility] primary care physician or advanced practice provider, you gain a long-term partner in health. Find a Primary Care Provider  Learn more about [facility]'s in-office and virtual care options: [facility]

## 2021-08-16 NOTE — Progress Notes (Signed)
Virtual Visit Consent   [facility], you are scheduled for a virtual visit with a [facility] provider today.     Just as with appointments in the office, your consent must be obtained to participate.  Your consent will be active for this visit and any virtual visit you may have with one of our providers in the next 365 days.     If you have a MyChart account, a copy of this consent can be sent to you electronically.  All virtual visits are billed to your insurance company just like a traditional visit in the office.    As this is a virtual visit, video technology does not allow for your provider to perform a traditional examination.  This may limit your provider's ability to fully assess your condition.  If your provider identifies any concerns that need to be evaluated in person or the need to arrange testing (such as labs, EKG, etc.), we will make arrangements to do so.     Although advances in technology are sophisticated, we cannot ensure that it will always work on either your end or our end.  If the connection with a video visit is poor, the visit may have to be switched to a telephone visit.  With either a video or telephone visit, we are not always able to ensure that we have a secure connection.     I need to obtain your verbal consent now.   Are you willing to proceed with your visit today?    Tonya [name] has provided verbal consent on 08/16/2021 for a virtual visit (video or telephone).   Tonya Loveless, PA-C   Date: 08/16/2021 2:04 PM   Virtual Visit via Video Note   I, Tonya [name], connected with  Tonya [name]  ([id number], May 30, 1986) on 08/16/21 at  2:00 PM EDT by a video-enabled telemedicine application and verified that I am speaking with the correct person using two identifiers.  Location: Patient: Virtual Visit Location Patient: Other: work;isolated Provider: Virtual Visit Location Provider: Home Office   I discussed the limitations of  evaluation and management by telemedicine and the availability of in person appointments. The patient expressed understanding and agreed to proceed.    History of Present Illness: Tonya [name] is a 35 y.o. who identifies as a female who was assigned female at birth, and is being seen today for sore throat.  HPI: Sore Throat  This is a new problem. The current episode started today. The problem has been gradually worsening. The pain is worse on the right (swollen right tonsillar gland) side. There has been no fever. The pain is at a severity of 4/10 (swallowing makes pain about an 8). The pain is moderate. Associated symptoms include a hoarse voice, swollen glands and trouble swallowing. Pertinent negatives include no congestion, coughing, drooling, ear discharge, ear pain, headaches, plugged ear sensation or shortness of breath. She has had no exposure to strep or mono. She has tried nothing for the symptoms.    Patient does have a history of tonsillar stones that have become stuck causing infection in 2017. Reports it was the same side that was affected last time as well. Resolved with antibiotic treatment. She is already drinking grapefruit juice to try to see if that would help.   Problems:  Patient Active Problem List   Diagnosis Date Noted   Active preterm labor 03/12/2015   Delivery of twins, both live 03/12/2015   [redacted] weeks gestation of  pregnancy    Dichorionic diamniotic twin pregnancy in third trimester    RLQ abdominal pain    Dichorionic diamniotic twin gestation    [redacted] weeks gestation of pregnancy    Preterm labor without delivery    Preterm labor 02/22/2015    Allergies:  Allergies  Allergen Reactions   Shrimp [Shellfish Allergy] Shortness Of Breath and Itching   Latex Itching and Other (See Comments)    Reaction:  Burning    Medications:  Current Outpatient Medications:    amoxicillin (AMOXIL) 500 MG capsule, Take 1 capsule (500 mg total) by mouth 3 (three) times daily  for 10 days., Disp: 30 capsule, Rfl: 0   lidocaine (XYLOCAINE) 2 % solution, Use as directed 15 mLs in the mouth or throat every 4 (four) hours as needed for mouth pain., Disp: 200 mL, Rfl: 0   benzonatate (TESSALON PERLES) 100 MG capsule, Take 1 capsule (100 mg total) by mouth 2 (two) times daily as needed., Disp: 20 capsule, Rfl: 0   ELDERBERRY PO, Take by mouth daily. Elderberry syrup 1 teaspoon qd, Disp: , Rfl:    escitalopram (LEXAPRO) 10 MG tablet, Take 1 tablet (10 mg total) by mouth at bedtime., Disp: 90 tablet, Rfl: 1   etonogestrel-ethinyl estradiol (NUVARING) 0.12-0.015 MG/24HR vaginal ring, Place 1 each vaginally every 28 (twenty-eight) days. Insert vaginally and leave in place for 3 consecutive weeks, then remove for 1 week., Disp: , Rfl:    Fluticasone-Salmeterol (ADVAIR) 250-50 MCG/DOSE AEPB, Inhale 2 puffs into the lungs daily as needed (for shortness of breath). , Disp: , Rfl:    Ibuprofen (ADVIL MIGRAINE) 200 MG CAPS, Take 1 capsule by mouth daily as needed (headache)., Disp: , Rfl:    levocetirizine (XYZAL) 5 MG tablet, TAKE 1 TABLET BY MOUTH EVERY DAY IN THE EVENING, Disp: 30 tablet, Rfl: 2   Multiple Vitamins-Minerals (HAIR SKIN NAILS PO), Take 2 tablets by mouth daily., Disp: , Rfl:    Multiple Vitamins-Minerals (ZINC PO), Take 1 tablet by mouth every evening., Disp: , Rfl:   Observations/Objective: Patient is well-developed, well-nourished in no acute distress.  Resting comfortably at home.  Head is normocephalic, atraumatic.  No labored breathing.  Speech is clear and coherent with logical content.  Patient is alert and oriented at baseline.  Patient reports tenderness to touch over the right tonsillar gland with swelling  Patient also self reports right tonsil is very red and swollen in the back of the throat  Assessment and Plan: 1. Sialadenitis - amoxicillin (AMOXIL) 500 MG capsule; Take 1 capsule (500 mg total) by mouth 3 (three) times daily for 10 days.  Dispense:  30 capsule; Refill: 0 - lidocaine (XYLOCAINE) 2 % solution; Use as directed 15 mLs in the mouth or throat every 4 (four) hours as needed for mouth pain.  Dispense: 200 mL; Refill: 0  - History of tonsillar stones with infection. - Will treat with amoxil as noted above - Viscous lidocaine for pain - Salt water gargles - Sour candies or lemon juice - Ibuprofen or Aleve PRN for pain and inflammation - Follow up in person if symptoms do not improve with treatment or if they worsen in the meantime  Follow Up Instructions: I discussed the assessment and treatment plan with the patient. The patient was provided an opportunity to ask questions and all were answered. The patient agreed with the plan and demonstrated an understanding of the instructions.  A copy of instructions were sent to the patient via MyChart.  The patient was advised to call back or seek an in-person evaluation if the symptoms worsen or if the condition fails to improve as anticipated.  Time:  I spent 13 minutes with the patient via telehealth technology discussing the above problems/concerns.    Mar Daring, PA-C

## 2021-08-30 DIAGNOSIS — Z419 Encounter for procedure for purposes other than remedying health state, unspecified: Secondary | ICD-10-CM | POA: Diagnosis not present

## 2021-09-21 ENCOUNTER — Other Ambulatory Visit: Payer: Self-pay | Admitting: Family Medicine

## 2021-09-21 DIAGNOSIS — F419 Anxiety disorder, unspecified: Secondary | ICD-10-CM

## 2021-09-29 DIAGNOSIS — Z419 Encounter for procedure for purposes other than remedying health state, unspecified: Secondary | ICD-10-CM | POA: Diagnosis not present

## 2021-10-30 DIAGNOSIS — Z419 Encounter for procedure for purposes other than remedying health state, unspecified: Secondary | ICD-10-CM | POA: Diagnosis not present

## 2021-11-04 ENCOUNTER — Other Ambulatory Visit: Payer: Self-pay | Admitting: Family Medicine

## 2021-11-04 DIAGNOSIS — F419 Anxiety disorder, unspecified: Secondary | ICD-10-CM

## 2021-11-14 ENCOUNTER — Other Ambulatory Visit: Payer: Self-pay | Admitting: Family Medicine

## 2021-11-14 DIAGNOSIS — J3089 Other allergic rhinitis: Secondary | ICD-10-CM

## 2021-11-29 DIAGNOSIS — Z419 Encounter for procedure for purposes other than remedying health state, unspecified: Secondary | ICD-10-CM | POA: Diagnosis not present

## 2021-12-03 ENCOUNTER — Other Ambulatory Visit: Payer: Self-pay | Admitting: Family Medicine

## 2021-12-03 DIAGNOSIS — F419 Anxiety disorder, unspecified: Secondary | ICD-10-CM

## 2021-12-26 ENCOUNTER — Other Ambulatory Visit: Payer: Self-pay | Admitting: Family Medicine

## 2021-12-26 DIAGNOSIS — F419 Anxiety disorder, unspecified: Secondary | ICD-10-CM

## 2021-12-30 DIAGNOSIS — Z419 Encounter for procedure for purposes other than remedying health state, unspecified: Secondary | ICD-10-CM | POA: Diagnosis not present

## 2021-12-30 NOTE — L&D Delivery Note (Signed)
Delivery Note NVD NOTE Date: 11/05/2022   Delivering Physician:  Bing Matter DO Anesthesia:   1. None  Pre-Delivery Diagnosis:   1.  IUP at 35 2/[redacted] weeks gestation 2.  PPROM 3.  LGA 4.  Suspect accessory lobe of placenta 5.  AMA 6.  Desires postpartum tubal ligation - consent signed 09/20/2022   Post-Delivery Diagnosis:   Same Accessory lobe of placenta  Delivery of liveborn female neonate  Procedure:  Spontaneous vaginal delivery  QBL: 976 mL  Complications:  None  LABOR AND DELIVERY SUMMARY The patient is a 37yo [place] at 29  2/[redacted] weeks gestation who presented to Labor and Delivery with spontaneous preterm rupture of membranes followed by regular contractions. She was admitted, placed on continuous EFM and toco.  Her labor course was managed expectantly.    The patient progressed to completely effaced and dilated at 1035 and had the strong urge to push so began pushing.  A liveborn female neonate was delivered via spontaneous vaginal delivery over an intact perineum from the direct OP position at 1045.  There was no nuchal cord.  Spontaneous cry was noted.  No shoulder dystocia was encountered.  Infant was placed on the maternal abdomen immediately following delivery.  Oropharynx and nasopharynx were bulb suctioned immediately following delivery.  Cord was doubly clamped and cut.  Infant was transferred to a warmer with an awaiting RN.  Additional segment of cord was doubly clamped and cut for cord gases. The placenta delivered spontaneously and apparently intact with a 3 vessel cord at 1112 and sent to pathology. Accessory lobe of placenta was noted. Pitocin was added to the patient's IV. Uterus was noted to be firm. No perineal, cervical, vaginal, labial or periuretheral lacerations were identified. Bladder was drained as patient was unable to spontaneously void. 800 mcg Cytotec placed PR for postpartum hemorrhage prophylaxis. All sponge, needle and instrument counts were correct at  the end of the procedure.  The patient and baby remained in the LDR in stable condition.   Baby Name: Tonya [name]  Delivery Summary for [facility]  Labor Events:   Preterm labor: No data found  Rupture date: 11/05/2022  Rupture time: 4:14 AM  Rupture type: Spontaneous Possible ROM - for evaluation  Fluid Color: Clear  Induction: No data found  Augmentation: No data found  Complications: No data found  Cervical ripening: No data found No data found   No data found     Delivery:   Episiotomy: No data found  Lacerations: No data found  Repair suture: No data found  Repair # of packets: No data found  Blood loss (ml): 115   Information for the patient's newborn:  Lurene, Robley [[id number]]   Delivery 11/05/2022 10:45 AM by  Vaginal, Spontaneous Sex:  female Gestational Age: 108w2d Delivery Clinician:   Living?:         APGARS  One minute Five minutes Ten minutes  Skin color:        Heart rate:        Grimace:        Muscle tone:        Breathing:        Totals: 8  9      Presentation/position:      Resuscitation:   Cord information:    Disposition of cord blood:     Blood gases sent?  Complications:   Placenta: Delivered:       appearance Newborn Measurements: Weight: 6 lb 14.4  oz (3130 g)  Height: 19.25"  Head circumference:    Chest circumference:    Other providers:    Additional  information: Forceps:   Vacuum:   Breech:   Observed anomalies         Kayne Yuhas D Geanna Divirgilio 11/05/2022, 11:44 AM

## 2022-01-18 ENCOUNTER — Other Ambulatory Visit: Payer: Self-pay | Admitting: Family Medicine

## 2022-01-18 DIAGNOSIS — F419 Anxiety disorder, unspecified: Secondary | ICD-10-CM

## 2022-01-22 ENCOUNTER — Other Ambulatory Visit: Payer: Self-pay | Admitting: Family Medicine

## 2022-01-22 DIAGNOSIS — F419 Anxiety disorder, unspecified: Secondary | ICD-10-CM

## 2022-01-28 MED ORDER — ESCITALOPRAM OXALATE 10 MG PO TABS: 10.0000 mg | ORAL_TABLET | Freq: Every day | ORAL | 0 refills | Status: DC

## 2022-01-30 ENCOUNTER — Encounter: Payer: Self-pay | Admitting: Family Medicine

## 2022-01-30 ENCOUNTER — Telehealth (INDEPENDENT_AMBULATORY_CARE_PROVIDER_SITE_OTHER): Payer: Medicaid Other | Admitting: Family Medicine

## 2022-01-30 DIAGNOSIS — F419 Anxiety disorder, unspecified: Secondary | ICD-10-CM

## 2022-01-30 DIAGNOSIS — Z419 Encounter for procedure for purposes other than remedying health state, unspecified: Secondary | ICD-10-CM | POA: Diagnosis not present

## 2022-01-30 DIAGNOSIS — F321 Major depressive disorder, single episode, moderate: Secondary | ICD-10-CM

## 2022-01-30 MED ORDER — ESCITALOPRAM OXALATE 20 MG PO TABS: 20.0000 mg | ORAL_TABLET | Freq: Every day | ORAL | 3 refills | Status: DC

## 2022-01-30 NOTE — Progress Notes (Signed)
Virtual Visit via Video Note  I connected with Tonya [name] on 01/30/22 at  4:30 PM EST by a video enabled telemedicine application 2/2 COVID-19 pandemic and verified that I am speaking with the correct person using two identifiers.  Location patient: home Location provider:work or home office Persons participating in the virtual visit: patient, provider  I discussed the limitations of evaluation and management by telemedicine and the availability of in person appointments. The patient expressed understanding and agreed to proceed.  Chief Complaint  Patient presents with   Medication Refill    escitalopram      HPI: Pt states she signed up for wt weight management with [facility].  Endorses lack of motivation to do anything but eat and sleep.  Pt cut out sodas for the last 5-6 wks.  Pt interested in an overall healthier   Pt had a 4.0 but her GPA has decreased significantly. Pt has been out of lexapro 10 mg x 2 wks.  Pt endorses increased crying in the last few wks.  States felt like lexapro was working, but may need to be adjusted.  In the past tried to other medications, but one of them made her feel worse.  Endorses mind races when trying to go to bed.  Wakes up tired and with a HA.  Endorses being worried about her son when he leaves the house.  Pt states her bp has improved since leaving her old job.  Trying to set boundaries by saying no more.  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  ROS: See pertinent positives and negatives per HPI.  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 Elderberry syrup 1 teaspoon qd, Disp: , Rfl:    escitalopram (LEXAPRO) 10 MG tablet, Take 1 tablet (10 mg total) by mouth daily., Disp: 30 tablet, Rfl: 0   etonogestrel-ethinyl estradiol (NUVARING) 0.12-0.015 MG/24HR vaginal ring, Place 1 each vaginally every 28 (twenty-eight) days. Insert vaginally and leave in place for 3 consecutive weeks, then remove for 1 week., Disp: , Rfl:    Fluticasone-Salmeterol (ADVAIR) 250-50 MCG/DOSE AEPB, Inhale 2 puffs into the lungs daily as needed (for shortness of breath). , Disp: , Rfl:    Ibuprofen 200 MG CAPS, Take 1 capsule by mouth daily as needed (headache)., Disp: , Rfl:    levocetirizine (XYZAL) 5 MG tablet, TAKE 1 TABLET BY MOUTH EVERY DAY IN THE EVENING, Disp: 30 tablet, Rfl: 2   Multiple Vitamins-Minerals (HAIR SKIN NAILS PO), Take 2 tablets by mouth daily., Disp: , Rfl:    Multiple Vitamins-Minerals (ZINC PO), Take 1 tablet by mouth every evening., Disp: , Rfl:    lidocaine (XYLOCAINE) 2 % solution, Use as directed 15 mLs in the mouth or throat every 4 (four) hours as needed for mouth pain. (Patient not taking: Reported on 01/30/2022), Disp: 200 mL, Rfl: 0  EXAM:  VITALS per patient if applicable: wt 205 lbs, RR between 12-20 bpm  GENERAL: alert, oriented, appears well and in no acute distress, mildly anxious and tearful at times.  HEENT: atraumatic, conjunctiva clear, no obvious abnormalities on inspection of external nose and ears  NECK: normal movements of the head and neck  LUNGS: on inspection no signs of respiratory distress, breathing rate appears normal, no obvious gross SOB, gasping or wheezing  CV: no obvious cyanosis  MS: moves all visible extremities without noticeable abnormality  PSYCH/NEURO: pleasant and cooperative, no obvious depression or anxiety, speech and thought processing grossly intact  Depression screen [facility] 2/9 01/30/2022 10/06/2017  Decreased  Interest 3 0  Down, Depressed, Hopeless 2 0  PHQ - 2 Score 5 0  Altered sleeping 3 -  Tired, decreased energy 3 -  Change in appetite 1 -  Feeling bad or failure about yourself  1 -  Trouble concentrating 1 -  Moving slowly or fidgety/restless 0 -  Suicidal thoughts 0 -  PHQ-9 Score 14 -  Difficult doing work/chores Extremely dIfficult -   GAD 7 : Generalized Anxiety Score 01/30/2022  Nervous, Anxious, on Edge 3  Control/stop worrying 3  Worry too much - different things 3  Trouble relaxing 3  Restless 0  Easily annoyed or irritable 3  Afraid - awful might happen 3  Total GAD 7 Score 18  Anxiety Difficulty Very difficult    ASSESSMENT AND PLAN:  Discussed the following assessment and plan:  Depression, major, single episode, moderate (HCC) -Worsening -PHQ-9 score 14 this visit - Plan: escitalopram (LEXAPRO) 20 MG tablet  Anxiety -Increasing -GAD 7 score 18 this visit - Plan: escitalopram (LEXAPRO) 20 MG tablet  Discussed restarting Lexapro versus switching to a different medication. Pt wishes to continue with Lexapro at a higher dose as has been well tolerated.  We will have patient restart Lexapro 10 mg daily x 1 week then increase to 20 mg daily thereafter.  Discussed counseling.  Patient open to the idea.  Given information about area [name] providers.  Patient encouraged to schedule an appointment.  Discussed sleep hygiene and self-care.  Given strict precautions.  Will have patient follow-up in 4-6 weeks after restarting Lexapro.  Has appointment next week for CPE.    I discussed the assessment and treatment plan with the patient. The patient was provided an opportunity to ask questions and all were answered. The patient agreed with the plan and demonstrated an understanding of the instructions.   The patient was advised to call back or seek an in-person evaluation if the symptoms worsen or if the condition fails to improve as anticipated.  I provided 31-33 minutes of  non-face-to-face time during this encounter.   Deeann Saint, MD

## 2022-01-30 NOTE — Patient Instructions (Signed)
Behavioral Health Services: -to make an appointment contact the office/provider you are interested in seeing.  No referral is needed.  The below is not an all inclusive list, but will help you get started.  [email or web address] -counseling located off of [address]  [email or web address] -website helps you find providers in your area  [facility] -Located off of [address] across from [employer]  Dr. Akintayo is a Psychiatrist with [place]. [phone]  [facility]  [facility]  -[address]  [phone] -a place in town that has counseling and Psychiatry services.    

## 2022-02-07 ENCOUNTER — Encounter: Payer: Medicaid Other | Admitting: Family Medicine

## 2022-02-11 ENCOUNTER — Ambulatory Visit: Payer: Medicaid Other | Admitting: Family Medicine

## 2022-02-22 ENCOUNTER — Other Ambulatory Visit: Payer: Self-pay | Admitting: Family Medicine

## 2022-02-22 DIAGNOSIS — F419 Anxiety disorder, unspecified: Secondary | ICD-10-CM

## 2022-02-22 DIAGNOSIS — F321 Major depressive disorder, single episode, moderate: Secondary | ICD-10-CM

## 2022-02-27 DIAGNOSIS — Z419 Encounter for procedure for purposes other than remedying health state, unspecified: Secondary | ICD-10-CM | POA: Diagnosis not present

## 2022-03-03 ENCOUNTER — Other Ambulatory Visit: Payer: Self-pay | Admitting: Family Medicine

## 2022-03-03 DIAGNOSIS — J3089 Other allergic rhinitis: Secondary | ICD-10-CM

## 2022-03-27 ENCOUNTER — Other Ambulatory Visit: Payer: Self-pay | Admitting: Family Medicine

## 2022-03-27 DIAGNOSIS — F321 Major depressive disorder, single episode, moderate: Secondary | ICD-10-CM

## 2022-03-27 DIAGNOSIS — F419 Anxiety disorder, unspecified: Secondary | ICD-10-CM

## 2022-03-30 DIAGNOSIS — Z419 Encounter for procedure for purposes other than remedying health state, unspecified: Secondary | ICD-10-CM | POA: Diagnosis not present

## 2022-04-19 ENCOUNTER — Inpatient Hospital Stay (HOSPITAL_COMMUNITY)
Admission: AD | Admit: 2022-04-19 | Discharge: 2022-04-19 | Disposition: A | Discharge status: Home / Self Care | Payer: Medicaid Other | Attending: Obstetrics & Gynecology | Admitting: Obstetrics & Gynecology

## 2022-04-19 ENCOUNTER — Encounter (HOSPITAL_COMMUNITY): Payer: Self-pay | Admitting: *Deleted

## 2022-04-19 ENCOUNTER — Inpatient Hospital Stay (HOSPITAL_COMMUNITY): Payer: Medicaid Other

## 2022-04-19 DIAGNOSIS — O209 Hemorrhage in early pregnancy, unspecified: Secondary | ICD-10-CM

## 2022-04-19 DIAGNOSIS — O418X10 Other specified disorders of amniotic fluid and membranes, first trimester, not applicable or unspecified: Secondary | ICD-10-CM

## 2022-04-19 DIAGNOSIS — Z113 Encounter for screening for infections with a predominantly sexual mode of transmission: Secondary | ICD-10-CM | POA: Insufficient documentation

## 2022-04-19 DIAGNOSIS — Z3A01 Less than 8 weeks gestation of pregnancy: Secondary | ICD-10-CM | POA: Insufficient documentation

## 2022-04-19 DIAGNOSIS — Z114 Encounter for screening for human immunodeficiency virus [HIV]: Secondary | ICD-10-CM | POA: Insufficient documentation

## 2022-04-19 DIAGNOSIS — O26899 Other specified pregnancy related conditions, unspecified trimester: Secondary | ICD-10-CM

## 2022-04-19 DIAGNOSIS — O200 Threatened abortion: Secondary | ICD-10-CM | POA: Diagnosis not present

## 2022-04-19 DIAGNOSIS — O468X1 Other antepartum hemorrhage, first trimester: Secondary | ICD-10-CM | POA: Diagnosis present

## 2022-04-19 DIAGNOSIS — O09521 Supervision of elderly multigravida, first trimester: Secondary | ICD-10-CM | POA: Diagnosis not present

## 2022-04-19 DIAGNOSIS — Z3490 Encounter for supervision of normal pregnancy, unspecified, unspecified trimester: Secondary | ICD-10-CM

## 2022-04-19 DIAGNOSIS — O208 Other hemorrhage in early pregnancy: Secondary | ICD-10-CM | POA: Diagnosis not present

## 2022-04-19 LAB — URINALYSIS, ROUTINE W REFLEX MICROSCOPIC
Bilirubin Urine: NEGATIVE
Glucose, UA: NEGATIVE mg/dL
Ketones, ur: NEGATIVE mg/dL
Leukocytes,Ua: NEGATIVE
Nitrite: NEGATIVE
Protein, ur: 30 mg/dL — AB
Specific Gravity, Urine: 1.03 (ref 1.005–1.030)
pH: 5 (ref 5.0–8.0)

## 2022-04-19 LAB — POCT PREGNANCY, URINE: Preg Test, Ur: POSITIVE — AB

## 2022-04-19 LAB — WET PREP, GENITAL
Clue Cells Wet Prep HPF POC: NONE SEEN
Sperm: NONE SEEN
Trich, Wet Prep: NONE SEEN
WBC, Wet Prep HPF POC: <10 (ref <10)
Yeast Wet Prep HPF POC: NONE SEEN

## 2022-04-19 LAB — CBC
HCT: 35 % — ABNORMAL LOW (ref 36.0–46.0)
Hemoglobin: 11.7 g/dL — ABNORMAL LOW (ref 12.0–15.0)
MCH: 31.3 pg (ref 26.0–34.0)
MCHC: 33.4 g/dL (ref 30.0–36.0)
MCV: 93.6 fL (ref 80.0–100.0)
Platelets: 282 10*3/uL (ref 150–400)
RBC: 3.74 MIL/uL — ABNORMAL LOW (ref 3.87–5.11)
RDW: 11.9 % (ref 11.5–15.5)
WBC: 6.1 10*3/uL (ref 4.0–10.5)
nRBC: 0 % (ref 0.0–0.2)

## 2022-04-19 LAB — HCG, QUANTITATIVE, PREGNANCY: hCG, Beta Chain, Quant, S: 84542 m[IU]/mL — ABNORMAL HIGH (ref <5)

## 2022-04-19 LAB — HIV ANTIBODY (ROUTINE TESTING W REFLEX): HIV Screen 4th Generation wRfx: NONREACTIVE

## 2022-04-19 NOTE — MAU Provider Note (Signed)
?History  ?  ? ?CSN: [id number] ? ?Arrival date and time: 04/19/22 1539 ? ? Event Date/Time  ? First Provider Initiated Contact with Patient 04/19/22 1644   ?  ? ?Chief Complaint  ?Patient presents with  ? Vaginal Bleeding  ? Abdominal Pain  ? Possible Pregnancy  ? ?[name] is a 36 y.o. year old [id number] female at [redacted]w[redacted]d weeks gestation who presents to MAU reporting at noon while she was seated getting her hair done she felt like she had to go to the BR. When she got up to use the BR, blood gushed down her legs, up the back side of her jeans and on the floor as she walked to the BR. She denies any VB now. She also reports abdominal cramping, but reports it "lightened up." She has taken 6 (+) HPTs. She is scheduled to start [facility] with [facility]; next appt is 05/06/2022. Her spouse is present and contributing to the history taking. ? ? ?OB History   ? ? Gravida  ?6  ? Para  ?3  ? Term  ?2  ? Preterm  ?1  ? AB  ?3  ? Living  ?2  ?  ? ? SAB  ?   ? IAB  ?3  ? Ectopic  ?   ? Multiple  ?1  ? Live Births  ?2  ?   ?  ?  ? ? ?Past Medical History:  ?Diagnosis Date  ? Anxiety   ? Arthritis   ? oa left shoulder  ? Asthma   ? Borderline hypertension 03-21-2021 pcp note in epic  ? sees primary md in 1 month for recehck  ? Depression   ? Migraine   ? occ  ? 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 normal chest xray 12-11-2020  ? ? ?Past Surgical History:  ?Procedure Laterality Date  ? NO PAST SURGERIES    ? ? ?Family History  ?Problem Relation Age of Onset  ? Hypertension Father   ? ? ?Social History  ? ?Tobacco Use  ? Smoking status: Never  ? Smokeless tobacco: Never  ?Vaping Use  ? Vaping Use: Never used  ?Substance Use Topics  ? Alcohol use: No  ? Drug use: No  ? ? ?Allergies:  ?Allergies  ?Allergen Reactions  ? Shrimp [Shellfish Allergy] Shortness Of Breath and Itching  ? Latex Itching and Other (See Comments)  ?  Reaction:  Burning   ? ? ?Medications Prior to Admission  ?Medication Sig Dispense Refill Last Dose  ?  ELDERBERRY PO Take by mouth daily. Elderberry syrup 1 teaspoon qd     ? escitalopram (LEXAPRO) 20 MG tablet Take 1 tablet (20 mg total) by mouth daily. 30 tablet 3   ? etonogestrel-ethinyl estradiol (NUVARING) 0.12-0.015 MG/24HR vaginal ring Place 1 each vaginally every 28 (twenty-eight) days. Insert vaginally and leave in place for 3 consecutive weeks, then remove for 1 week.     ? Fluticasone-Salmeterol (ADVAIR) 250-50 MCG/DOSE AEPB Inhale 2 puffs into the lungs daily as needed (for shortness of breath).      ? Ibuprofen 200 MG CAPS Take 1 capsule by mouth daily as needed (headache).     ? levocetirizine (XYZAL) 5 MG tablet TAKE 1 TABLET BY MOUTH EVERY DAY IN THE EVENING 30 tablet 2   ? lidocaine (XYLOCAINE) 2 % solution Use as directed 15 mLs in the mouth or throat every 4 (four) hours as needed for mouth pain. (Patient not taking: Reported on  01/30/2022) 200 mL 0   ? Multiple Vitamins-Minerals (HAIR SKIN NAILS PO) Take 2 tablets by mouth daily.     ? Multiple Vitamins-Minerals (ZINC PO) Take 1 tablet by mouth every evening.     ? ? ?Review of Systems  ?Constitutional: Negative.   ?HENT: Negative.    ?Eyes: Negative.   ?Respiratory: Negative.    ?Cardiovascular: Negative.   ?Gastrointestinal: Negative.   ?Endocrine: Negative.   ?Genitourinary:  Positive for pelvic pain (cramping). Negative for vaginal bleeding ("gushed when I stood up to go to the BR.").  ?Musculoskeletal: Negative.   ?Skin: Negative.   ?Allergic/Immunologic: Negative.   ?Neurological: Negative.   ?Hematological: Negative.   ?Psychiatric/Behavioral: Negative.    ?Physical Exam  ? ?Blood pressure (!) 123/56, pulse 71, temperature 98.8 ?F (37.1 ?C), temperature source Oral, resp. rate 18, height 5\' 2"  (1.575 m), weight 94.3 kg, last menstrual period 03/03/2022, SpO2 100 %. ? ?Physical Exam ?Vitals and nursing note reviewed.  ?Constitutional:   ?   Appearance: Normal appearance. She is normal weight.  ?Cardiovascular:  ?   Rate and Rhythm: Normal  rate.  ?Pulmonary:  ?   Effort: Pulmonary effort is normal.  ?Abdominal:  ?   Palpations: Abdomen is soft.  ?Genitourinary: ?   Comments: Swabs collected by patient via blind swab technique ?Musculoskeletal:     ?   General: Normal range of motion.  ?Skin: ?   General: Skin is warm and dry.  ?Neurological:  ?   Mental Status: She is alert and oriented to person, place, and time.  ?Psychiatric:     ?   Mood and Affect: Mood normal.     ?   Behavior: Behavior normal.     ?   Thought Content: Thought content normal.     ?   Judgment: Judgment normal.  ? ? ?MAU Course  ?Procedures ? ?MDM ?CCUA ?UPT ?CBC ?ABO/Rh -- not drawn d/t known B POS ?HCG ?Wet Prep ?GC/CT -- pending ?HIV -- pending ?OB < 14 wks [place] with TV ? ?Results for orders placed or performed during the hospital encounter of 04/19/22 (from the past 24 hour(s))  ?CBC     Status: Abnormal  ? Collection Time: 04/19/22  4:33 PM  ?Result Value Ref Range  ? WBC 6.1 4.0 - 10.5 K/uL  ? RBC 3.74 (L) 3.87 - 5.11 MIL/uL  ? Hemoglobin 11.7 (L) 12.0 - 15.0 g/dL  ? HCT 35.0 (L) 36.0 - 46.0 %  ? MCV 93.6 80.0 - 100.0 fL  ? MCH 31.3 26.0 - 34.0 pg  ? MCHC 33.4 30.0 - 36.0 g/dL  ? RDW 11.9 11.5 - 15.5 %  ? Platelets 282 150 - 400 K/uL  ? nRBC 0.0 0.0 - 0.2 %  ?hCG, quantitative, pregnancy     Status: Abnormal  ? Collection Time: 04/19/22  4:33 PM  ?Result Value Ref Range  ? hCG, Beta Chain, Quant, S 84,542 (H) <5 mIU/mL  ?HIV Antibody (routine testing w rflx)     Status: None  ? Collection Time: 04/19/22  4:33 PM  ?Result Value Ref Range  ? HIV Screen 4th Generation wRfx Non Reactive Non Reactive  ?Wet prep, genital     Status: None  ? Collection Time: 04/19/22  6:01 PM  ? Specimen: PATH Cytology Cervicovaginal Ancillary Only  ?Result Value Ref Range  ? Yeast Wet Prep HPF POC NONE SEEN NONE SEEN  ? Trich, Wet Prep NONE SEEN NONE SEEN  ? Clue Cells Wet  Prep HPF POC NONE SEEN NONE SEEN  ? WBC, Wet Prep HPF POC <10 <10  ? Sperm NONE SEEN   ?Pregnancy, urine POC     Status:  Abnormal  ? Collection Time: 04/19/22  6:03 PM  ?Result Value Ref Range  ? Preg Test, Ur POSITIVE (A) NEGATIVE  ?Urinalysis, Routine w reflex microscopic Urine, Clean Catch     Status: Abnormal  ? Collection Time: 04/19/22  6:07 PM  ?Result Value Ref Range  ? Color, Urine AMBER (A) YELLOW  ? APPearance HAZY (A) CLEAR  ? Specific Gravity, Urine 1.030 1.005 - 1.030  ? pH 5.0 5.0 - 8.0  ? Glucose, UA NEGATIVE NEGATIVE mg/dL  ? Hgb urine dipstick MODERATE (A) NEGATIVE  ? Bilirubin Urine NEGATIVE NEGATIVE  ? Ketones, ur NEGATIVE NEGATIVE mg/dL  ? Protein, ur 30 (A) NEGATIVE mg/dL  ? Nitrite NEGATIVE NEGATIVE  ? Leukocytes,Ua NEGATIVE NEGATIVE  ? RBC / HPF 0-5 0 - 5 RBC/hpf  ? WBC, UA 0-5 0 - 5 WBC/hpf  ? Bacteria, UA RARE (A) NONE SEEN  ? Squamous Epithelial / LPF 6-10 0 - 5  ? Mucus PRESENT   ? ? ?US OB LESS THAN 14 WEEKS WITH OB TRANSVAGINAL ? ?Result Date: 04/19/2022 ?CLINICAL DATA:  Vaginal bleeding in 1st trimester pregnancy. EXAM: OBSTETRIC <14 WK [place] AND TRANSVAGINAL OB US TECHNIQUE: Both transabdominal and transvaginal ultrasound examinations were performed for complete evaluation of the gestation as well as the maternal uterus, adnexal regions, and pelvic cul-de-sac. Transvaginal technique was performed to assess early pregnancy. COMPARISON:  None. FINDINGS: Intrauterine gestational sac: Single Yolk sac:  Visualized. Embryo:  Visualized. Cardiac Activity: Visualized. Heart Rate: 130 bpm CRL:  9 mm   6 w   5 d                  [place] EDC: 12/08/2022 Subchorionic hemorrhage:  Moderate subchorionic hemorrhage is noted. Maternal uterus/adnexae: Both ovaries are normal in appearance. No mass or abnormal free fluid identified. IMPRESSION: Single living IUP with estimated gestational age of [redacted] weeks 5 days, and [place] EDC of 12/08/2022. Moderate subchorionic hemorrhage noted. Electronically Signed   By: Marlaine Hind M.D.   On: 04/19/2022 17:57   ?  ? ?Assessment and Plan  ?Intrauterine pregnancy  ?- [place] pictures given  ?- Patient  and spouse reassured of well-being of fetus at time of d/c ? ?Subchorionic hematoma in first trimester, single or unspecified fetus  ?- Information provided on [facility] ?- Advised of increased risk of threatened miscarriag

## 2022-04-19 NOTE — MAU Note (Signed)
Tonya [name] is a 36 y.o. at Unknown here in MAU reporting: was getting hair done, felt like she needed to use the restroom.  When she stood up, blood just gushed down her legs. Passed a big clot. Is not bleeding now.  Cramps had lightened up. First appt was to be 5/8. +HPT x6 ?LMP: 3/5 ?Onset of complaint: 1200 ?Pain score: 2 ?Vitals:  ? 04/19/22 1607  ?BP: (!) 123/56  ?Pulse: 71  ?Resp: 18  ?Temp: 98.8 ?F (37.1 ?C)  ?SpO2: 100%  ?   ? ?Lab orders placed from triage:  UPT ? ? ?FYI,  father just died on 04/30/2023.  She is an only child and very stressed ?

## 2022-04-19 NOTE — Discharge Instructions (Signed)
Return to MAU: If you have heavier bleeding that soaks through more that 2 pads per hour for an hour or more If you bleed so much that you feel like you might pass out or you do pass out If you have significant abdominal pain that is not improved with Tylenol 1000 mg every 8 hours as needed for pain If you develop a fever > 100.5  

## 2022-04-22 ENCOUNTER — Other Ambulatory Visit: Payer: Self-pay | Admitting: Family Medicine

## 2022-04-22 DIAGNOSIS — F321 Major depressive disorder, single episode, moderate: Secondary | ICD-10-CM

## 2022-04-22 DIAGNOSIS — F419 Anxiety disorder, unspecified: Secondary | ICD-10-CM

## 2022-04-22 LAB — GC/CHLAMYDIA PROBE AMP (~~LOC~~) NOT AT ARMC
Chlamydia: NEGATIVE
Comment: NEGATIVE
Comment: NORMAL
Neisseria Gonorrhea: NEGATIVE

## 2022-04-29 DIAGNOSIS — Z419 Encounter for procedure for purposes other than remedying health state, unspecified: Secondary | ICD-10-CM | POA: Diagnosis not present

## 2022-05-06 DIAGNOSIS — N912 Amenorrhea, unspecified: Secondary | ICD-10-CM | POA: Diagnosis not present

## 2022-05-06 DIAGNOSIS — Z3A09 9 weeks gestation of pregnancy: Secondary | ICD-10-CM | POA: Diagnosis not present

## 2022-05-06 DIAGNOSIS — O3680X9 Pregnancy with inconclusive fetal viability, other fetus: Secondary | ICD-10-CM | POA: Diagnosis not present

## 2022-05-30 DIAGNOSIS — Z419 Encounter for procedure for purposes other than remedying health state, unspecified: Secondary | ICD-10-CM | POA: Diagnosis not present

## 2022-06-24 DIAGNOSIS — Z369 Encounter for antenatal screening, unspecified: Secondary | ICD-10-CM | POA: Diagnosis not present

## 2022-06-24 DIAGNOSIS — N912 Amenorrhea, unspecified: Secondary | ICD-10-CM | POA: Diagnosis not present

## 2022-06-24 LAB — OB RESULTS CONSOLE RUBELLA ANTIBODY, IGM: Rubella: IMMUNE

## 2022-06-24 LAB — OB RESULTS CONSOLE HIV ANTIBODY (ROUTINE TESTING): HIV: NONREACTIVE

## 2022-06-24 LAB — OB RESULTS CONSOLE HEPATITIS B SURFACE ANTIGEN: Hepatitis B Surface Ag: NEGATIVE

## 2022-06-24 LAB — HEPATITIS C ANTIBODY: HCV Ab: NEGATIVE

## 2022-06-29 DIAGNOSIS — Z419 Encounter for procedure for purposes other than remedying health state, unspecified: Secondary | ICD-10-CM | POA: Diagnosis not present

## 2022-07-23 DIAGNOSIS — Z363 Encounter for antenatal screening for malformations: Secondary | ICD-10-CM | POA: Diagnosis not present

## 2022-07-23 DIAGNOSIS — Z3A20 20 weeks gestation of pregnancy: Secondary | ICD-10-CM | POA: Diagnosis not present

## 2022-07-30 DIAGNOSIS — Z419 Encounter for procedure for purposes other than remedying health state, unspecified: Secondary | ICD-10-CM | POA: Diagnosis not present

## 2022-08-27 ENCOUNTER — Telehealth: Payer: Self-pay

## 2022-08-27 NOTE — Telephone Encounter (Signed)
Attempted to call pt to schedule appt with PCP.  LVM instructions for pt to call to schedule an appt for her earliest convenience.

## 2022-08-27 NOTE — Telephone Encounter (Signed)
Pt states she is pregnant, seeing her ob and does not need to come in for an appt with Dr Salomon Fick

## 2022-08-30 DIAGNOSIS — Z419 Encounter for procedure for purposes other than remedying health state, unspecified: Secondary | ICD-10-CM | POA: Diagnosis not present

## 2022-09-04 DIAGNOSIS — F418 Other specified anxiety disorders: Secondary | ICD-10-CM | POA: Diagnosis not present

## 2022-09-04 DIAGNOSIS — O418X99 Other specified disorders of amniotic fluid and membranes, unspecified trimester, other fetus: Secondary | ICD-10-CM | POA: Diagnosis not present

## 2022-09-04 DIAGNOSIS — Z3492 Encounter for supervision of normal pregnancy, unspecified, second trimester: Secondary | ICD-10-CM | POA: Diagnosis not present

## 2022-09-04 DIAGNOSIS — Z331 Pregnant state, incidental: Secondary | ICD-10-CM | POA: Diagnosis not present

## 2022-09-04 DIAGNOSIS — Z332 Encounter for elective termination of pregnancy: Secondary | ICD-10-CM | POA: Diagnosis not present

## 2022-09-04 DIAGNOSIS — O09522 Supervision of elderly multigravida, second trimester: Secondary | ICD-10-CM | POA: Diagnosis not present

## 2022-09-04 DIAGNOSIS — Z23 Encounter for immunization: Secondary | ICD-10-CM | POA: Diagnosis not present

## 2022-09-04 DIAGNOSIS — Z8751 Personal history of pre-term labor: Secondary | ICD-10-CM | POA: Diagnosis not present

## 2022-09-04 DIAGNOSIS — Z6836 Body mass index (BMI) 36.0-36.9, adult: Secondary | ICD-10-CM | POA: Diagnosis not present

## 2022-09-20 DIAGNOSIS — O26849 Uterine size-date discrepancy, unspecified trimester: Secondary | ICD-10-CM | POA: Diagnosis not present

## 2022-09-20 DIAGNOSIS — Z3A28 28 weeks gestation of pregnancy: Secondary | ICD-10-CM | POA: Diagnosis not present

## 2022-09-29 DIAGNOSIS — Z419 Encounter for procedure for purposes other than remedying health state, unspecified: Secondary | ICD-10-CM | POA: Diagnosis not present

## 2022-10-04 ENCOUNTER — Other Ambulatory Visit: Payer: Self-pay | Admitting: Family Medicine

## 2022-10-04 DIAGNOSIS — J3089 Other allergic rhinitis: Secondary | ICD-10-CM

## 2022-10-15 DIAGNOSIS — O26849 Uterine size-date discrepancy, unspecified trimester: Secondary | ICD-10-CM | POA: Diagnosis not present

## 2022-10-15 DIAGNOSIS — Z3A32 32 weeks gestation of pregnancy: Secondary | ICD-10-CM | POA: Diagnosis not present

## 2022-10-29 DIAGNOSIS — Z362 Encounter for other antenatal screening follow-up: Secondary | ICD-10-CM | POA: Diagnosis not present

## 2022-10-29 LAB — OB RESULTS CONSOLE GBS: GBS: NEGATIVE

## 2022-10-30 DIAGNOSIS — Z419 Encounter for procedure for purposes other than remedying health state, unspecified: Secondary | ICD-10-CM | POA: Diagnosis not present

## 2022-11-05 ENCOUNTER — Inpatient Hospital Stay (HOSPITAL_COMMUNITY)
Admission: AD | Admit: 2022-11-05 | Discharge: 2022-11-07 | DRG: 796 | Disposition: A | Discharge status: Home / Self Care | Payer: Medicaid Other | Attending: Obstetrics and Gynecology | Admitting: Obstetrics and Gynecology

## 2022-11-05 ENCOUNTER — Encounter (HOSPITAL_COMMUNITY): Payer: Self-pay | Admitting: Obstetrics and Gynecology

## 2022-11-05 ENCOUNTER — Other Ambulatory Visit: Payer: Self-pay

## 2022-11-05 DIAGNOSIS — O42919 Preterm premature rupture of membranes, unspecified as to length of time between rupture and onset of labor, unspecified trimester: Secondary | ICD-10-CM | POA: Diagnosis not present

## 2022-11-05 DIAGNOSIS — Z302 Encounter for sterilization: Secondary | ICD-10-CM | POA: Diagnosis not present

## 2022-11-05 DIAGNOSIS — Z3A35 35 weeks gestation of pregnancy: Secondary | ICD-10-CM | POA: Diagnosis not present

## 2022-11-05 DIAGNOSIS — O3663X0 Maternal care for excessive fetal growth, third trimester, not applicable or unspecified: Secondary | ICD-10-CM | POA: Diagnosis present

## 2022-11-05 DIAGNOSIS — O4202 Full-term premature rupture of membranes, onset of labor within 24 hours of rupture: Secondary | ICD-10-CM

## 2022-11-05 DIAGNOSIS — O42913 Preterm premature rupture of membranes, unspecified as to length of time between rupture and onset of labor, third trimester: Secondary | ICD-10-CM | POA: Diagnosis not present

## 2022-11-05 DIAGNOSIS — O43193 Other malformation of placenta, third trimester: Secondary | ICD-10-CM | POA: Diagnosis present

## 2022-11-05 DIAGNOSIS — O99214 Obesity complicating childbirth: Secondary | ICD-10-CM | POA: Diagnosis present

## 2022-11-05 LAB — CBC
HCT: 35.3 % — ABNORMAL LOW (ref 36.0–46.0)
Hemoglobin: 12.3 g/dL (ref 12.0–15.0)
MCH: 32.8 pg (ref 26.0–34.0)
MCHC: 34.8 g/dL (ref 30.0–36.0)
MCV: 94.1 fL (ref 80.0–100.0)
Platelets: 245 10*3/uL (ref 150–400)
RBC: 3.75 MIL/uL — ABNORMAL LOW (ref 3.87–5.11)
RDW: 12.5 % (ref 11.5–15.5)
WBC: 6.3 10*3/uL (ref 4.0–10.5)
nRBC: 0 % (ref 0.0–0.2)

## 2022-11-05 LAB — TYPE AND SCREEN
ABO/RH(D): B POS
Antibody Screen: NEGATIVE

## 2022-11-05 LAB — RPR: RPR Ser Ql: NONREACTIVE

## 2022-11-05 LAB — POCT FERN TEST: POCT Fern Test: POSITIVE

## 2022-11-05 MED ORDER — COCONUT OIL OIL: 1.0000 | TOPICAL_OIL | Status: DC | PRN

## 2022-11-05 MED ORDER — ONDANSETRON HCL 4 MG/2ML IJ SOLN: 4.0000 mg | INTRAMUSCULAR | Status: DC | PRN

## 2022-11-05 MED ORDER — ONDANSETRON HCL 4 MG/2ML IJ SOLN
4.0000 mg | Freq: Four times a day (QID) | INTRAMUSCULAR | Status: DC | PRN
  Administered 2022-11-05: 4 mg via INTRAVENOUS
  Filled 2022-11-05: qty 2

## 2022-11-05 MED ORDER — SODIUM CHLORIDE 0.9 % IV SOLN: 5.0000 10*6.[IU] | Freq: Once | INTRAVENOUS | Status: DC

## 2022-11-05 MED ORDER — SENNOSIDES-DOCUSATE SODIUM 8.6-50 MG PO TABS
2.0000 | ORAL_TABLET | Freq: Every day | ORAL | Status: DC
  Administered 2022-11-07: 2 via ORAL
  Filled 2022-11-05: qty 2

## 2022-11-05 MED ORDER — DIPHENHYDRAMINE HCL 25 MG PO CAPS: 25.0000 mg | ORAL_CAPSULE | Freq: Four times a day (QID) | ORAL | Status: DC | PRN

## 2022-11-05 MED ORDER — LACTATED RINGERS IV SOLN
500.0000 mL | INTRAVENOUS | Status: DC | PRN
  Administered 2022-11-05: 500 mL via INTRAVENOUS

## 2022-11-05 MED ORDER — PHENYLEPHRINE 80 MCG/ML (10ML) SYRINGE FOR IV PUSH (FOR BLOOD PRESSURE SUPPORT): 80.0000 ug | PREFILLED_SYRINGE | INTRAVENOUS | Status: DC | PRN

## 2022-11-05 MED ORDER — ESCITALOPRAM OXALATE 20 MG PO TABS
20.0000 mg | ORAL_TABLET | Freq: Every day | ORAL | Status: DC
  Administered 2022-11-05 – 2022-11-07 (×2): 20 mg via ORAL
  Filled 2022-11-05 (×2): qty 1

## 2022-11-05 MED ORDER — ONDANSETRON HCL 4 MG PO TABS: 4.0000 mg | ORAL_TABLET | ORAL | Status: DC | PRN

## 2022-11-05 MED ORDER — SOD CITRATE-CITRIC ACID 500-334 MG/5ML PO SOLN: 30.0000 mL | ORAL | Status: DC | PRN

## 2022-11-05 MED ORDER — LIDOCAINE HCL (PF) 1 % IJ SOLN: 30.0000 mL | INTRAMUSCULAR | Status: DC | PRN

## 2022-11-05 MED ORDER — TETANUS-DIPHTH-ACELL PERTUSSIS 5-2.5-18.5 LF-MCG/0.5 IM SUSY: 0.5000 mL | PREFILLED_SYRINGE | Freq: Once | INTRAMUSCULAR | Status: DC

## 2022-11-05 MED ORDER — DIBUCAINE (PERIANAL) 1 % EX OINT: 1.0000 | TOPICAL_OINTMENT | CUTANEOUS | Status: DC | PRN

## 2022-11-05 MED ORDER — EPHEDRINE 5 MG/ML INJ: 10.0000 mg | INTRAVENOUS | Status: DC | PRN

## 2022-11-05 MED ORDER — LEVOCETIRIZINE DIHYDROCHLORIDE 5 MG PO TABS: 5.0000 mg | ORAL_TABLET | Freq: Every evening | ORAL | Status: DC

## 2022-11-05 MED ORDER — SIMETHICONE 80 MG PO CHEW: 80.0000 mg | CHEWABLE_TABLET | ORAL | Status: DC | PRN

## 2022-11-05 MED ORDER — OXYTOCIN-SODIUM CHLORIDE 30-0.9 UT/500ML-% IV SOLN
2.5000 [IU]/h | INTRAVENOUS | Status: DC
  Filled 2022-11-05: qty 500

## 2022-11-05 MED ORDER — LORATADINE 10 MG PO TABS
10.0000 mg | ORAL_TABLET | Freq: Every evening | ORAL | Status: DC
  Administered 2022-11-05 – 2022-11-06 (×2): 10 mg via ORAL
  Filled 2022-11-05 (×2): qty 1

## 2022-11-05 MED ORDER — FENTANYL-BUPIVACAINE-NACL 0.5-0.125-0.9 MG/250ML-% EP SOLN: 12.0000 mL/h | EPIDURAL | Status: DC | PRN

## 2022-11-05 MED ORDER — ACETAMINOPHEN 325 MG PO TABS: 650.0000 mg | ORAL_TABLET | ORAL | Status: DC | PRN

## 2022-11-05 MED ORDER — PENICILLIN G POT IN DEXTROSE 60000 UNIT/ML IV SOLN: 3.0000 10*6.[IU] | INTRAVENOUS | Status: DC

## 2022-11-05 MED ORDER — MISOPROSTOL 200 MCG PO TABS
ORAL_TABLET | ORAL | Status: AC
  Administered 2022-11-05: 800 ug via RECTAL
  Filled 2022-11-05: qty 4

## 2022-11-05 MED ORDER — ZOLPIDEM TARTRATE 5 MG PO TABS: 5.0000 mg | ORAL_TABLET | Freq: Every evening | ORAL | Status: DC | PRN

## 2022-11-05 MED ORDER — LACTATED RINGERS IV SOLN: INTRAVENOUS | Status: DC

## 2022-11-05 MED ORDER — FENTANYL CITRATE (PF) 100 MCG/2ML IJ SOLN
100.0000 ug | INTRAMUSCULAR | Status: DC | PRN
  Administered 2022-11-05: 100 ug via INTRAVENOUS
  Filled 2022-11-05: qty 2

## 2022-11-05 MED ORDER — MISOPROSTOL 200 MCG PO TABS: 800.0000 ug | ORAL_TABLET | Freq: Once | ORAL | Status: AC

## 2022-11-05 MED ORDER — DIPHENHYDRAMINE HCL 50 MG/ML IJ SOLN: 12.5000 mg | INTRAMUSCULAR | Status: DC | PRN

## 2022-11-05 MED ORDER — WITCH HAZEL-GLYCERIN EX PADS: 1.0000 | MEDICATED_PAD | CUTANEOUS | Status: DC | PRN

## 2022-11-05 MED ORDER — PRENATAL MULTIVITAMIN CH
1.0000 | ORAL_TABLET | Freq: Every day | ORAL | Status: DC
  Administered 2022-11-05 – 2022-11-07 (×3): 1 via ORAL
  Filled 2022-11-05 (×3): qty 1

## 2022-11-05 MED ORDER — LACTATED RINGERS IV SOLN: 500.0000 mL | Freq: Once | INTRAVENOUS | Status: DC

## 2022-11-05 MED ORDER — FLUTICASONE FUROATE-VILANTEROL 200-25 MCG/ACT IN AEPB: 1.0000 | INHALATION_SPRAY | Freq: Every day | RESPIRATORY_TRACT | Status: DC | PRN

## 2022-11-05 MED ORDER — BENZOCAINE-MENTHOL 20-0.5 % EX AERO: 1.0000 | INHALATION_SPRAY | CUTANEOUS | Status: DC | PRN

## 2022-11-05 MED ORDER — OXYTOCIN BOLUS FROM INFUSION
333.0000 mL | Freq: Once | INTRAVENOUS | Status: AC
  Administered 2022-11-05: 333 mL via INTRAVENOUS

## 2022-11-05 MED ORDER — IBUPROFEN 600 MG PO TABS
600.0000 mg | ORAL_TABLET | Freq: Four times a day (QID) | ORAL | Status: DC
  Administered 2022-11-05 – 2022-11-07 (×7): 600 mg via ORAL
  Filled 2022-11-05 (×7): qty 1

## 2022-11-05 NOTE — Progress Notes (Signed)
Tonya [name] is a 36 y.o. [phone] at [redacted]w[redacted]d admitted for preterm rupture of membranes  Subjective: Patient uncomfortable with contractions and feeling pressure with contractions. Request IV pain medication.  Objective: BP 118/69   Pulse 82   Temp 98.3 F (36.8 C) (Oral)   Resp 20   Ht 5\' 3"  (1.6 m)   Wt 98.4 kg   LMP 03/03/2022   BMI 38.44 kg/m  No intake/output data recorded. No intake/output data recorded.  FHT:  FHR: 130 bpm, variability: moderate,  accelerations:  Present,  decelerations:  Present occasional variable decelerations UC:   regular, every 2-3 minutes SVE:   Dilation: 2 (feeling urge to push) Effacement (%): 70, 80 Station: -2 Exam by:: Tomma Rakers RN 2/70/-2 per Dr. Ivin Poot  Labs: Lab Results  Component Value Date   WBC 6.3 11/05/2022   HGB 12.3 11/05/2022   HCT 35.3 (L) 11/05/2022   MCV 94.1 11/05/2022   PLT 245 11/05/2022    Assessment / Plan: Spontaneous labor, progressing normally  Labor: Progressing normally Preeclampsia:   n/a Fetal Wellbeing:  Category I Pain Control:  IV pain meds I/D:  n/a Anticipated MOD:  NSVD  Josefine Class, MD 11/05/2022, 9:07 AM

## 2022-11-05 NOTE — Lactation Note (Signed)
This note was copied from a baby's chart. Lactation Consultation Note  Patient Name: Tonya [name] [id number] Date: 11/05/2022 Reason for consult: L&D Initial assessment;Late-preterm 34-36.6wks;Breastfeeding assistance Age:38 hours  LC entered the room and the infant was STS with the birth parent.  LC assisted the birth parent with latching the infant to the right breast.  The infant latched with lips flanged, tongue was down, sucking was rhythmic, and some swallows were noted.  The infant fed for 10 min while the LC was in the room and was still feeding when the [place] left.  The birth parent stated that she has never breast fed before and she has not taken any classes.  LC spoke with the parents about milk production in the early days of life.  All questions were answered.  The parents are aware that they will be seen on the [facility].  Maternal Data Does the patient have breastfeeding experience prior to this delivery?: No  Feeding Mother's Current Feeding Choice: Breast Milk and Formula  LATCH Score Latch: Grasps breast easily, tongue down, lips flanged, rhythmical sucking.  Audible Swallowing: Spontaneous and intermittent  Type of Nipple: Everted at rest and after stimulation  Comfort (Breast/Nipple): Soft / non-tender  Hold (Positioning): Assistance needed to correctly position infant at breast and maintain latch.  LATCH Score: 9   Lactation Tools Discussed/Used    Interventions Interventions: Assisted with latch;Education  Discharge    Consult Status Consult Status: Follow-up from L&D Date: 11/05/22 Follow-up type: In-patient    Elly Modena Shray Hunley 11/05/2022, 11:41 AM

## 2022-11-05 NOTE — MAU Provider Note (Signed)
Event Date/Time   First Provider Initiated Contact with Patient 11/05/22 0500       S: Ms. NYAH SHEPHERD is a 36 y.o. [phone] at [redacted]w[redacted]d  who presents to MAU today complaining of leaking of fluid since 0414. She denies vaginal bleeding. She endorses contractions. She reports normal fetal movement.    O: BP 125/76 (BP Location: Right Arm)   Pulse (!) 109   Temp 98.2 F (36.8 C) (Oral)   Resp 16   Ht 5\' 3"  (1.6 m)   Wt 98.5 kg   LMP 03/03/2022   BMI 38.48 kg/m  GENERAL: Well-developed, well-nourished female in no acute distress.  HEAD: Normocephalic, atraumatic.  CHEST: Normal effort of breathing, regular heart rate ABDOMEN: Soft, nontender, gravid PELVIC: Normal external female genitalia. Vagina is pink and rugated. Cervix with normal contour, no lesions. Normal discharge.  Positive pooling.   Cervical exam:  Dilation: 1 Station: Ballotable Presentation: Vertex Exam by:: Rosine Abe, CNM   Fetal Monitoring: Baseline: 135 Variability: Mod Accelerations: N/A Decelerations: N/A Contractions: q 2-3 min    A: SIUP at [redacted]w[redacted]d  Grossly ruptured 0414 today Vertex by BSUS Report called to Dr. Mancel Bale called at [phone] Per Dr. Mancel Bale, patient has recent GBS negative result  P: Admit to L&D  Mallie Snooks, [place], MSN, CNM 11/05/2022 5:25 AM

## 2022-11-05 NOTE — H&P (Addendum)
Tonya [name] is a 36 y.o. female presenting for SROM.  Pt did not feel any contractions when she called but upon arrival to MAU pt was contracting per MAU q60min.  No VB and + FM.  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 Social History:  reports that she has never smoked. She has never used smokeless tobacco. She reports that she does not drink alcohol and does not use drugs.     Maternal Diabetes: No Genetic Screening: Normal Maternal Ultrasounds/Referrals: Normal Fetal Ultrasounds or other Referrals:  None Maternal Substance Abuse:  No Significant Maternal Medications:  Meds include: Other: escitalopram, levocetirizine, pepcid, vaginal progesterone Significant Maternal Lab Results:  Group B Strep negative Number of Prenatal Visits:greater than 3 verified prenatal visits Other Comments:  None  Review of Systems Denies F/C/N/V/D  History Dilation: 1 Station: Ballotable Exam by:: Rosine Abe, CNM Blood pressure 115/65, pulse 83, temperature 98.3 F (36.8 C), temperature source Oral, resp. rate 20, height 5\' 3"  (1.6 m), weight 98.4 kg, last menstrual period 03/03/2022. Exam Physical Exam  Lungs CTA CV RRR Abdomen gravid, NT Extremities no calf tenderness  Prenatal labs: ABO, Rh: --/--/B POS (11/07 0525) Antibody: NEG (11/07 0525) Rubella:  Immune RPR:   NR HBsAg:   Negative HIV: Non Reactive (04/21 1633)   GBS:   Negative  Assessment/Plan: G7P4 at 35 2/7wks admitted with SROM and now contracting.  Plan expectant mgmt for now.  Cat 1.   Delice Lesch 11/05/2022, 7:24 AM

## 2022-11-05 NOTE — MAU Note (Signed)
Pt says SROM- 0352- while she was asleep- woke - with water running down her legs.  PNC- [facility] No VE Denies HSV GBS- collected  Feels mild , irreg UC's

## 2022-11-06 ENCOUNTER — Encounter (HOSPITAL_COMMUNITY)
Admission: AD | Disposition: A | Discharge status: Home / Self Care | Payer: Self-pay | Source: Home / Self Care | Attending: Obstetrics and Gynecology

## 2022-11-06 ENCOUNTER — Inpatient Hospital Stay (HOSPITAL_COMMUNITY): Payer: Medicaid Other | Admitting: Anesthesiology

## 2022-11-06 ENCOUNTER — Encounter (HOSPITAL_COMMUNITY): Payer: Self-pay | Admitting: Obstetrics and Gynecology

## 2022-11-06 DIAGNOSIS — Z302 Encounter for sterilization: Secondary | ICD-10-CM

## 2022-11-06 HISTORY — PX: TUBAL LIGATION: SHX77

## 2022-11-06 LAB — LACTATE DEHYDROGENASE: LDH: 229 U/L — ABNORMAL HIGH (ref 98–192)

## 2022-11-06 LAB — CBC
HCT: 32.1 % — ABNORMAL LOW (ref 36.0–46.0)
Hemoglobin: 11.1 g/dL — ABNORMAL LOW (ref 12.0–15.0)
MCH: 33 pg (ref 26.0–34.0)
MCHC: 34.6 g/dL (ref 30.0–36.0)
MCV: 95.5 fL (ref 80.0–100.0)
Platelets: 216 10*3/uL (ref 150–400)
RBC: 3.36 MIL/uL — ABNORMAL LOW (ref 3.87–5.11)
RDW: 12.4 % (ref 11.5–15.5)
WBC: 11.5 10*3/uL — ABNORMAL HIGH (ref 4.0–10.5)
nRBC: 0 % (ref 0.0–0.2)

## 2022-11-06 LAB — COMPREHENSIVE METABOLIC PANEL
ALT: 19 U/L (ref 0–44)
AST: 38 U/L (ref 15–41)
Albumin: 2.4 g/dL — ABNORMAL LOW (ref 3.5–5.0)
Alkaline Phosphatase: 92 U/L (ref 38–126)
Anion gap: 7 (ref 5–15)
BUN: 6 mg/dL (ref 6–20)
CO2: 22 mmol/L (ref 22–32)
Calcium: 8.8 mg/dL — ABNORMAL LOW (ref 8.9–10.3)
Chloride: 111 mmol/L (ref 98–111)
Creatinine, Ser: 0.64 mg/dL (ref 0.44–1.00)
GFR, Estimated: >60 mL/min (ref >60)
Glucose, Bld: 76 mg/dL (ref 70–99)
Potassium: 3.4 mmol/L — ABNORMAL LOW (ref 3.5–5.1)
Sodium: 140 mmol/L (ref 135–145)
Total Bilirubin: 0.6 mg/dL (ref 0.3–1.2)
Total Protein: 4.9 g/dL — ABNORMAL LOW (ref 6.5–8.1)

## 2022-11-06 LAB — URIC ACID: Uric Acid, Serum: 4.6 mg/dL (ref 2.5–7.1)

## 2022-11-06 SURGERY — LIGATION, FALLOPIAN TUBE, POSTPARTUM
Anesthesia: Spinal | Laterality: Bilateral

## 2022-11-06 MED ORDER — MIDAZOLAM HCL 2 MG/2ML IJ SOLN
INTRAMUSCULAR | Status: AC
  Filled 2022-11-06: qty 2

## 2022-11-06 MED ORDER — AMISULPRIDE (ANTIEMETIC) 5 MG/2ML IV SOLN: 10.0000 mg | Freq: Once | INTRAVENOUS | Status: DC | PRN

## 2022-11-06 MED ORDER — OXYCODONE HCL 5 MG PO TABS: 5.0000 mg | ORAL_TABLET | Freq: Once | ORAL | Status: DC | PRN

## 2022-11-06 MED ORDER — OXYCODONE HCL 5 MG/5ML PO SOLN: 5.0000 mg | Freq: Once | ORAL | Status: DC | PRN

## 2022-11-06 MED ORDER — CEFAZOLIN SODIUM-DEXTROSE 2-3 GM-%(50ML) IV SOLR
INTRAVENOUS | Status: DC | PRN
  Administered 2022-11-06: 2 g via INTRAVENOUS

## 2022-11-06 MED ORDER — BUPIVACAINE HCL (PF) 0.25 % IJ SOLN
INTRAMUSCULAR | Status: DC | PRN
  Administered 2022-11-06: 30 mL

## 2022-11-06 MED ORDER — BUPIVACAINE IN DEXTROSE 0.75-8.25 % IT SOLN
INTRATHECAL | Status: DC | PRN
  Administered 2022-11-06: 1.8 mL via INTRATHECAL

## 2022-11-06 MED ORDER — PROPOFOL 500 MG/50ML IV EMUL
INTRAVENOUS | Status: DC | PRN
  Administered 2022-11-06: 25 ug/kg/min via INTRAVENOUS

## 2022-11-06 MED ORDER — OXYCODONE HCL 5 MG PO TABS: 5.0000 mg | ORAL_TABLET | ORAL | Status: DC | PRN

## 2022-11-06 MED ORDER — PHENYLEPHRINE 80 MCG/ML (10ML) SYRINGE FOR IV PUSH (FOR BLOOD PRESSURE SUPPORT)
PREFILLED_SYRINGE | INTRAVENOUS | Status: DC | PRN
  Administered 2022-11-06: 160 ug via INTRAVENOUS
  Administered 2022-11-06: 80 ug via INTRAVENOUS

## 2022-11-06 MED ORDER — MIDAZOLAM HCL 2 MG/2ML IJ SOLN
INTRAMUSCULAR | Status: DC | PRN
  Administered 2022-11-06: 2 mg via INTRAVENOUS

## 2022-11-06 MED ORDER — METOCLOPRAMIDE HCL 10 MG PO TABS
10.0000 mg | ORAL_TABLET | Freq: Once | ORAL | Status: AC
  Administered 2022-11-06: 10 mg via ORAL
  Filled 2022-11-06: qty 1

## 2022-11-06 MED ORDER — FENTANYL CITRATE (PF) 100 MCG/2ML IJ SOLN
INTRAMUSCULAR | Status: DC | PRN
  Administered 2022-11-06: 100 ug via INTRAVENOUS

## 2022-11-06 MED ORDER — FAMOTIDINE 20 MG PO TABS
40.0000 mg | ORAL_TABLET | Freq: Once | ORAL | Status: AC
  Administered 2022-11-06: 40 mg via ORAL
  Filled 2022-11-06: qty 2

## 2022-11-06 MED ORDER — FENTANYL CITRATE (PF) 100 MCG/2ML IJ SOLN
INTRAMUSCULAR | Status: AC
  Filled 2022-11-06: qty 2

## 2022-11-06 MED ORDER — HYDROMORPHONE HCL 1 MG/ML IJ SOLN: 0.2500 mg | INTRAMUSCULAR | Status: DC | PRN

## 2022-11-06 MED ORDER — BUPIVACAINE HCL (PF) 0.25 % IJ SOLN
INTRAMUSCULAR | Status: AC
  Filled 2022-11-06: qty 30

## 2022-11-06 MED ORDER — LACTATED RINGERS IV SOLN: INTRAVENOUS | Status: DC

## 2022-11-06 MED ORDER — ONDANSETRON HCL 4 MG/2ML IJ SOLN: 4.0000 mg | Freq: Once | INTRAMUSCULAR | Status: DC | PRN

## 2022-11-06 SURGICAL SUPPLY — 25 items
CLOTH BEACON ORANGE TIMEOUT ST (SAFETY) ×1 IMPLANT
DERMABOND ADVANCED .7 DNX12 (GAUZE/BANDAGES/DRESSINGS) ×1 IMPLANT
DRSG OPSITE POSTOP 3X4 (GAUZE/BANDAGES/DRESSINGS) ×1 IMPLANT
DURAPREP 26ML APPLICATOR (WOUND CARE) ×1 IMPLANT
ELECT REM PT RETURN 9FT ADLT (ELECTROSURGICAL) ×1
ELECTRODE REM PT RTRN 9FT ADLT (ELECTROSURGICAL) ×1 IMPLANT
GLOVE BIO SURGEON STRL SZ7.5 (GLOVE) ×1 IMPLANT
GLOVE BIOGEL PI IND STRL 7.0 (GLOVE) ×1 IMPLANT
GLOVE BIOGEL PI IND STRL 7.5 (GLOVE) ×1 IMPLANT
GOWN STRL REUS W/TWL LRG LVL3 (GOWN DISPOSABLE) ×2 IMPLANT
LIGASURE IMPACT 36 18CM CVD LR (INSTRUMENTS) IMPLANT
NEEDLE HYPO 22GX1.5 SAFETY (NEEDLE) ×1 IMPLANT
NS IRRIG 1000ML POUR BTL (IV SOLUTION) ×1 IMPLANT
PACK ABDOMINAL MINOR (CUSTOM PROCEDURE TRAY) ×1 IMPLANT
PENCIL BUTTON HOLSTER BLD 10FT (ELECTRODE) ×1 IMPLANT
PROTECTOR NERVE ULNAR (MISCELLANEOUS) ×1 IMPLANT
SPONGE LAP 4X18 RFD (DISPOSABLE) IMPLANT
SUT MNCRL AB 3-0 PS2 27 (SUTURE) ×1 IMPLANT
SUT PLAIN 0 NONE (SUTURE) IMPLANT
SUT VIC AB 0 CT1 36 (SUTURE) ×1 IMPLANT
SUT VIC AB 2-0 SH 27 (SUTURE) ×2
SUT VIC AB 2-0 SH 27XBRD (SUTURE) ×2 IMPLANT
SYR CONTROL 10ML LL (SYRINGE) ×1 IMPLANT
TOWEL OR 17X24 6PK STRL BLUE (TOWEL DISPOSABLE) ×2 IMPLANT
TRAY FOLEY CATH SILVER 14FR (SET/KITS/TRAYS/PACK) ×1 IMPLANT

## 2022-11-06 NOTE — Anesthesia Preprocedure Evaluation (Addendum)
Anesthesia Evaluation  Patient identified by MRN, date of birth, ID band Patient awake    Reviewed: Allergy & Precautions, NPO status , Patient's Chart, lab work & pertinent test results  Airway Mallampati: II  TM Distance: >3 FB Neck ROM: Full    Dental no notable dental hx. (+) Teeth Intact, Dental Advisory Given   Pulmonary asthma    Pulmonary exam normal breath sounds clear to auscultation       Cardiovascular hypertension, Normal cardiovascular exam Rhythm:Regular Rate:Normal  Borderline HTN on no Rx   Neuro/Psych  Headaches PSYCHIATRIC DISORDERS Anxiety Depression       GI/Hepatic Neg liver ROS,GERD  ,,  Endo/Other  Obesity  Renal/GU negative Renal ROS  negative genitourinary   Musculoskeletal  (+) Arthritis , Osteoarthritis,    Abdominal  (+) + obese  Peds  Hematology  (+) Blood dyscrasia, anemia   Anesthesia Other Findings   Reproductive/Obstetrics Desires sterilization post partum                             Anesthesia Physical Anesthesia Plan  ASA: 2  Anesthesia Plan: Spinal   Post-op Pain Management: Regional block*   Induction: Intravenous  PONV Risk Score and Plan: 4 or greater and Treatment may vary due to age or medical condition and Ondansetron  Airway Management Planned: Natural Airway and Nasal Cannula  Additional Equipment: None  Intra-op Plan:   Post-operative Plan:   Informed Consent: I have reviewed the patients History and Physical, chart, labs and discussed the procedure including the risks, benefits and alternatives for the proposed anesthesia with the patient or authorized representative who has indicated his/her understanding and acceptance.     Dental advisory given  Plan Discussed with: Anesthesiologist and CRNA  Anesthesia Plan Comments:         Anesthesia Quick Evaluation

## 2022-11-06 NOTE — Op Note (Addendum)
Preop Diagnosis: 1.PPD 1 2.Desires Sterilization   Postop Diagnosis: 1.PPD 1 2.Desires Sterilization  Procedure: Bilateral Salpingectomy  Anesthesia: Epidural   Anesthesiologist: Mal Amabile, MD   Attending: Osborn Coho, MD   Assistant: Reesa Chew, MD  Findings: Nl appearing bilateral ovaries and tubes  Pathology: Bilateral Tubes  Fluids: See flowsheet  UOP: See flowsheet  EBL: Minimal about 10cc  Complications: None  Procedure:  The patient was taken to the operating room after the risks, benefits, alternative, complications, treatment options, and expected outcomes were discussed with the patient. The patient verbalized understanding, the patient concurred with the proposed plan and consent signed and witnessed. The patient was taken to the Operating Room, identified as tubal sterilization and the procedure verified as bilateral tubal ligation. A Time Out was held and the above information confirmed.  The patient was given a surgical level via the epidural and prepped, draped, and catheterized in the normal, sterile fashion in the supine position.  A 36mm incision was made at the umbilicus and carried down to the underlying layer of fascia and peritoneum entered without difficulty.  The right fallopian tube was indentified and carried out to its fimbriated end, elevated tube with the babcock and ligated with the ligasure.  The same was done on the contralateral side.    The fascia was then repaired with 0 vicryl via a running stitch and the incision was closed with 3-0 vicryl via subcuticular sutures and dermabond applied.  Instrument, sponge, and needle counts were correct, patient tolerated the procedure well and was returned to the recovery room in good condition.    An assistant was needed to help retract due to the complexity of the case.

## 2022-11-06 NOTE — Social Work (Signed)
MOB was referred for history of depression/anxiety.  * Referral screened out by Clinical Social Worker because none of the following criteria appear to apply:  ~ History of anxiety/depression during this pregnancy, or of post-partum depression following prior delivery.  ~ Diagnosis of anxiety and/or depression within last 3 years OR * MOB's symptoms currently being treated with medication and/or therapy. Per chart review MOB stable on Lexapro.  Please contact the Clinical Social Worker if needs arise or by MOB request.  Archie Shea, LCSWA Clinical Social Worker [phone] 

## 2022-11-06 NOTE — Transfer of Care (Signed)
Immediate Anesthesia Transfer of Care Note  Patient: Tonya [name]  Procedure(s) Performed: POST PARTUM TUBAL LIGATION (Bilateral)  Patient Location: PACU  Anesthesia Type:Spinal  Level of Consciousness: awake, alert , oriented, and patient cooperative  Airway & Oxygen Therapy: Patient Spontanous Breathing  Post-op Assessment: Report given to RN and Post -op Vital signs reviewed and stable  Post vital signs: Reviewed and stable  Last Vitals:  Vitals Value Taken Time  BP 101/62 11/06/22 1645  Temp 36.3 C 11/06/22 1633  Pulse 66 11/06/22 1646  Resp 15 11/06/22 1646  SpO2 100 % 11/06/22 1646  Vitals shown include unvalidated device data.  Last Pain:  Vitals:   11/06/22 1633  TempSrc: Oral  PainSc: 0-No pain         Complications: No notable events documented.

## 2022-11-06 NOTE — Progress Notes (Signed)
Post Partum Day 1 Subjective: no complaints, up ad lib, voiding, and scheduled for sterilization as the pt desires permanent sterilization.  Objective: Blood pressure 118/74, pulse 68, temperature 98.4 F (36.9 C), temperature source Oral, resp. rate 18, height 5\' 3"  (1.6 m), weight 98.4 kg, last menstrual period 03/03/2022, SpO2 98 %, unknown if currently breastfeeding.  Physical Exam:  General: alert and no distress Lochia: appropriate Uterine Fundus: firm Incision: n/a DVT Evaluation: No cords or calf tenderness.  Recent Labs    11/05/22 0525 11/06/22 0519  HGB 12.3 11.1*  HCT 35.3* 32.1*    Assessment/Plan: Plan for discharge tomorrow and Contraception scheduled for sterilization today.  Risks benefits alternatives discussed with the patient including but not limited to bleeding infection and injury as well as risk of regret risk of failure and possible ectopic.  Questions answered and consent signed and witnessed.  Pt is agreeable to bilateral salpingectomy.   LOS: 1 day   13/08/23, MD 11/06/2022, 3:21 PM

## 2022-11-06 NOTE — Anesthesia Postprocedure Evaluation (Signed)
Anesthesia Post Note  Patient: Tonya [name]  Procedure(s) Performed: POST PARTUM TUBAL LIGATION (Bilateral)     Patient location during evaluation: PACU Anesthesia Type: Spinal Level of consciousness: oriented and awake and alert Pain management: pain level controlled Vital Signs Assessment: post-procedure vital signs reviewed and stable Respiratory status: spontaneous breathing, respiratory function stable and nonlabored ventilation Cardiovascular status: blood pressure returned to baseline and stable Postop Assessment: no headache, no backache, no apparent nausea or vomiting and spinal receding Anesthetic complications: no   No notable events documented.  Last Vitals:  Vitals:   11/06/22 1705 11/06/22 1715  BP:  105/61  Pulse:  63  Resp:  15  Temp:    SpO2: 100% 100%    Last Pain:  Vitals:   11/06/22 1633  TempSrc: Oral  PainSc: 0-No pain   Pain Goal:    LLE Motor Response: No movement due to regional block, No movement to painful stimulus (11/06/22 1715) LLE Sensation: Numbness (11/06/22 1715) RLE Motor Response: No movement to painful stimulus, No movement due to regional block (11/06/22 1715) RLE Sensation: Numbness (11/06/22 1715) L Sensory Level: T7-Lower costal margin (11/06/22 1715) R Sensory Level: T7-Lower costal margin (11/06/22 1715) Epidural/Spinal Function Cutaneous sensation: No Sensation (11/06/22 1715), Patient able to flex knees: No (11/06/22 1715), Patient able to lift hips off bed: No (11/06/22 1715), Back pain beyond tenderness at insertion site: No (11/06/22 1715), Progressively worsening motor and/or sensory loss: No (11/06/22 1715), Bowel and/or bladder incontinence post epidural: No (11/06/22 1715)  [name] A.

## 2022-11-06 NOTE — Lactation Note (Signed)
This note was copied from a baby's chart. Lactation Consultation Note  Patient Name: Tonya [name] [id number] Date: 11/06/2022 Reason for consult: Follow-up assessment (LC called [facility] and she was in the room with mom and per mom declined LC and has changed to formula.) Age:38 hours   Consult Status Consult Status: Complete Date: 11/06/22    Matilde Sprang Hiyab Nhem 11/06/2022, 8:11 AM

## 2022-11-06 NOTE — Lactation Note (Signed)
This note was copied from a baby's chart. Lactation Consultation Note  Patient Name: Tonya [name] Today's Date: 11/06/2022   Age:38 hours RN ( TY) will ask Birth Parent if she would like to be seen by [facility] services tonight.  Maternal Data    Feeding Nipple Type: Extra Slow Flow  LATCH Score                    Lactation Tools Discussed/Used    Interventions    Discharge    Consult Status      [name] 11/06/2022, 12:32 AM

## 2022-11-06 NOTE — Anesthesia Procedure Notes (Signed)
Spinal  Patient location during procedure: OR Start time: 11/06/2022 3:31 PM End time: 11/06/2022 3:36 PM Reason for block: surgical anesthesia Staffing Performed: anesthesiologist  Anesthesiologist: Mal Amabile, MD Performed by: Mal Amabile, MD Authorized by: Mal Amabile, MD   Preanesthetic Checklist Completed: patient identified, IV checked, site marked, risks and benefits discussed, surgical consent, monitors and equipment checked, pre-op evaluation and timeout performed Spinal Block Patient position: sitting Prep: DuraPrep and site prepped and draped Patient monitoring: heart rate, cardiac monitor, continuous pulse ox and blood pressure Approach: midline Location: L3-4 Injection technique: single-shot Needle Needle type: Pencan  Needle gauge: 24 G Needle length: 9 cm Needle insertion depth: 7 cm Assessment Sensory level: T4 Events: CSF return Additional Notes Patient tolerated procedure well. Adequate sensory level. Attempt x 2.

## 2022-11-06 NOTE — Lactation Note (Signed)
This note was copied from a baby's chart. Lactation Consultation Note  Patient Name: Tonya [name] [id number] Date: 11/06/2022   Age:38 hours Per RN ( Ty) Birth Parent declined [facility] services tonight.  Maternal Data    Feeding Nipple Type: Extra Slow Flow  LATCH Score                    Lactation Tools Discussed/Used    Interventions    Discharge    Consult Status      [name] 11/06/2022, 12:38 AM

## 2022-11-07 ENCOUNTER — Encounter (HOSPITAL_COMMUNITY): Payer: Self-pay | Admitting: Obstetrics and Gynecology

## 2022-11-07 ENCOUNTER — Other Ambulatory Visit: Payer: Self-pay

## 2022-11-07 LAB — SURGICAL PATHOLOGY

## 2022-11-07 MED ORDER — BENZOCAINE-MENTHOL 20-0.5 % EX AERO: 1.0000 | INHALATION_SPRAY | CUTANEOUS | 1 refills | Status: AC | PRN

## 2022-11-07 MED ORDER — IBUPROFEN 600 MG PO TABS: 600.0000 mg | ORAL_TABLET | Freq: Four times a day (QID) | ORAL | 0 refills | Status: AC

## 2022-11-07 MED ORDER — ACETAMINOPHEN 325 MG PO TABS: 650.0000 mg | ORAL_TABLET | ORAL | 0 refills | Status: AC | PRN

## 2022-11-07 NOTE — Discharge Summary (Signed)
Postpartum Discharge Summary  Date of Service updated11/09/23     Patient Name: Tonya [name] DOB: [birth year] MRN: [id number]  Date of admission: 11/05/2022 Delivery date:11/05/2022  Delivering provider: Bing Matter D  Date of discharge: 11/07/2022  Admitting diagnosis: Preterm premature rupture of membranes [O42.919] Intrauterine pregnancy: [redacted]w[redacted]d    Secondary diagnosis:  Principal Problem:   Preterm premature rupture of membranes  Additional problems: NA    Discharge diagnosis: Preterm Pregnancy Delivered                                              Post partum procedures:postpartum tubal ligation Augmentation: N/A Complications: None  Hospital course: Onset of Labor With Vaginal Delivery      36y.o. yo [id number] admitted in Latent Labor on 11/05/2022. Labor course was complicated by pprom.  HAD AN SVD.  AND PPTL PPD1  Membrane Rupture Time/Date: 4:14 AM ,11/05/2022   Delivery Method:Vaginal, Spontaneous  Episiotomy: None  Lacerations:  None  Patient had a postpartum course complicated by NA.  She is ambulating, tolerating a regular diet, passing flatus, and urinating well. Patient is discharged home in stable condition on 11/07/22.  Newborn Data: Birth date:11/05/2022  Birth time:10:45 AM  Gender:Female  Living status:Living  Apgars:8 ,9  Weight:3130 g   Magnesium Sulfate received: No BMZ received: No Rhophylac:No MMR:No T-DaP:Given postpartum Flu: No Transfusion:No  Physical exam  Vitals:   11/06/22 2008 11/06/22 2358 11/07/22 0611 11/07/22 1000  BP: 119/69 121/80 120/67 127/84  Pulse: 73 63 66 66  Resp: _0 Temp: 98.8 F (37.1 C) 98.8 F (37.1 C) 98.6 F (37 C) 98.9 F (37.2 C)  TempSrc: Oral Oral Oral Oral  SpO2: 100% 99%  100%  Weight:      Height:       General: alert and cooperative Lochia: appropriate Uterine Fundus: firm Incision: CDI DVT Evaluation: Negative Homan's sign. Labs: Lab Results  Component Value  Date   WBC 11.5 (H) 11/06/2022   HGB 11.1 (L) 11/06/2022   HCT 32.1 (L) 11/06/2022   MCV 95.5 11/06/2022   PLT 216 11/06/2022      Latest Ref Rng & Units 11/06/2022    5:19 AM  CMP  Glucose 70 - 99 mg/dL 76   BUN 6 - 20 mg/dL 6   Creatinine 0.44 - 1.00 mg/dL 0.64   Sodium 135 - 145 mmol/L 140   Potassium 3.5 - 5.1 mmol/L 3.4   Chloride 98 - 111 mmol/L 111   CO2 22 - 32 mmol/L 22   Calcium 8.9 - 10.3 mg/dL 8.8   Total Protein 6.5 - 8.1 g/dL 4.9   Total Bilirubin 0.3 - 1.2 mg/dL 0.6   Alkaline Phos 38 - 126 U/L 92   AST 15 - 41 U/L 38   ALT 0 - 44 U/L 19    Edinburgh Score:    11/05/2022    5:17 PM  Edinburgh Postnatal Depression Scale Screening Tool  I have been able to laugh and see the funny side of things. 0  I have looked forward with enjoyment to things. 0  I have blamed myself unnecessarily when things went wrong. 0  I have been anxious or worried for no good reason. 2  I have felt scared or panicky for no good reason. 0  Things have  been getting on top of me. 1  I have been so unhappy that I have had difficulty sleeping. 1  I have felt sad or miserable. 0  I have been so unhappy that I have been crying. 0  The thought of harming myself has occurred to me. 0  Edinburgh Postnatal Depression Scale Total 4      After visit meds:  Allergies as of 11/07/2022       Reactions   Shrimp [shellfish Allergy] Shortness Of Breath, Itching   Latex Itching, Other (See Comments)   Reaction:  Burning         Medication List     STOP taking these medications    HAIR SKIN NAILS PO   lidocaine 2 % solution Commonly known as: XYLOCAINE   ZINC PO       TAKE these medications    acetaminophen 325 MG tablet Commonly known as: Tylenol Take 2 tablets (650 mg total) by mouth every 4 (four) hours as needed (for pain scale < 4).   benzocaine-Menthol 20-0.5 % Aero Commonly known as: DERMOPLAST Apply 1 Application topically as needed for irritation (perineal  discomfort).   cholecalciferol 25 MCG (1000 UNIT) tablet Commonly known as: VITAMIN D3 Take 1,000 Units by mouth daily.   ELDERBERRY PO Take by mouth daily. Elderberry syrup 1 teaspoon qd   escitalopram 20 MG tablet Commonly known as: LEXAPRO TAKE 1 TABLET BY MOUTH EVERY DAY   Fluticasone-Salmeterol 250-50 MCG/DOSE Aepb Commonly known as: ADVAIR Inhale 2 puffs into the lungs daily as needed (for shortness of breath).   ibuprofen 600 MG tablet Commonly known as: ADVIL Take 1 tablet (600 mg total) by mouth every 6 (six) hours.   levocetirizine 5 MG tablet Commonly known as: XYZAL TAKE 1 TABLET BY MOUTH EVERY DAY IN THE EVENING   prenatal multivitamin Tabs tablet Take 1 tablet by mouth daily at 12 noon.         Discharge home in stable condition Infant Feeding: Breast Infant Disposition:home with mother Discharge instruction: per After Visit Summary and Postpartum booklet. Activity: Advance as tolerated. Pelvic rest for 6 weeks.  Diet: routine diet Anticipated Birth Control: BTL done PP Postpartum Appointment:6 weeks Additional Postpartum F/U:  6 WEEKS  Future Appointments:No future appointments. Follow up Visit:  [facility]. Schedule an appointment as soon as possible for a visit in 6 week(s).   Specialty: Obstetrics and Gynecology Contact information: [address] [place] [address] [phone]                    11/07/2022 Betsy Coder, MD

## 2022-11-08 LAB — SURGICAL PATHOLOGY

## 2022-11-13 ENCOUNTER — Telehealth (HOSPITAL_COMMUNITY): Payer: Self-pay

## 2022-11-13 NOTE — Telephone Encounter (Signed)
No answer. No voicemail option available.  Marcelino Duster [facility] 11/13/22,1933

## 2022-11-29 DIAGNOSIS — Z419 Encounter for procedure for purposes other than remedying health state, unspecified: Secondary | ICD-10-CM | POA: Diagnosis not present

## 2022-12-11 DIAGNOSIS — F418 Other specified anxiety disorders: Secondary | ICD-10-CM | POA: Diagnosis not present

## 2022-12-11 DIAGNOSIS — E559 Vitamin D deficiency, unspecified: Secondary | ICD-10-CM | POA: Diagnosis not present

## 2022-12-30 DIAGNOSIS — Z419 Encounter for procedure for purposes other than remedying health state, unspecified: Secondary | ICD-10-CM | POA: Diagnosis not present

## 2023-01-06 IMAGING — DX DG LUMBAR SPINE COMPLETE 4+V
5 series · 5 of 5 positions shown · non-contrast
Comparison: February 28, 2014

CLINICAL DATA: Chronic low back pain with radiation in the legs and
urine leakage by report. The 34-year-old female

EXAM:
LUMBAR SPINE - COMPLETE 4+ VIEW

[lumbar spine ap]
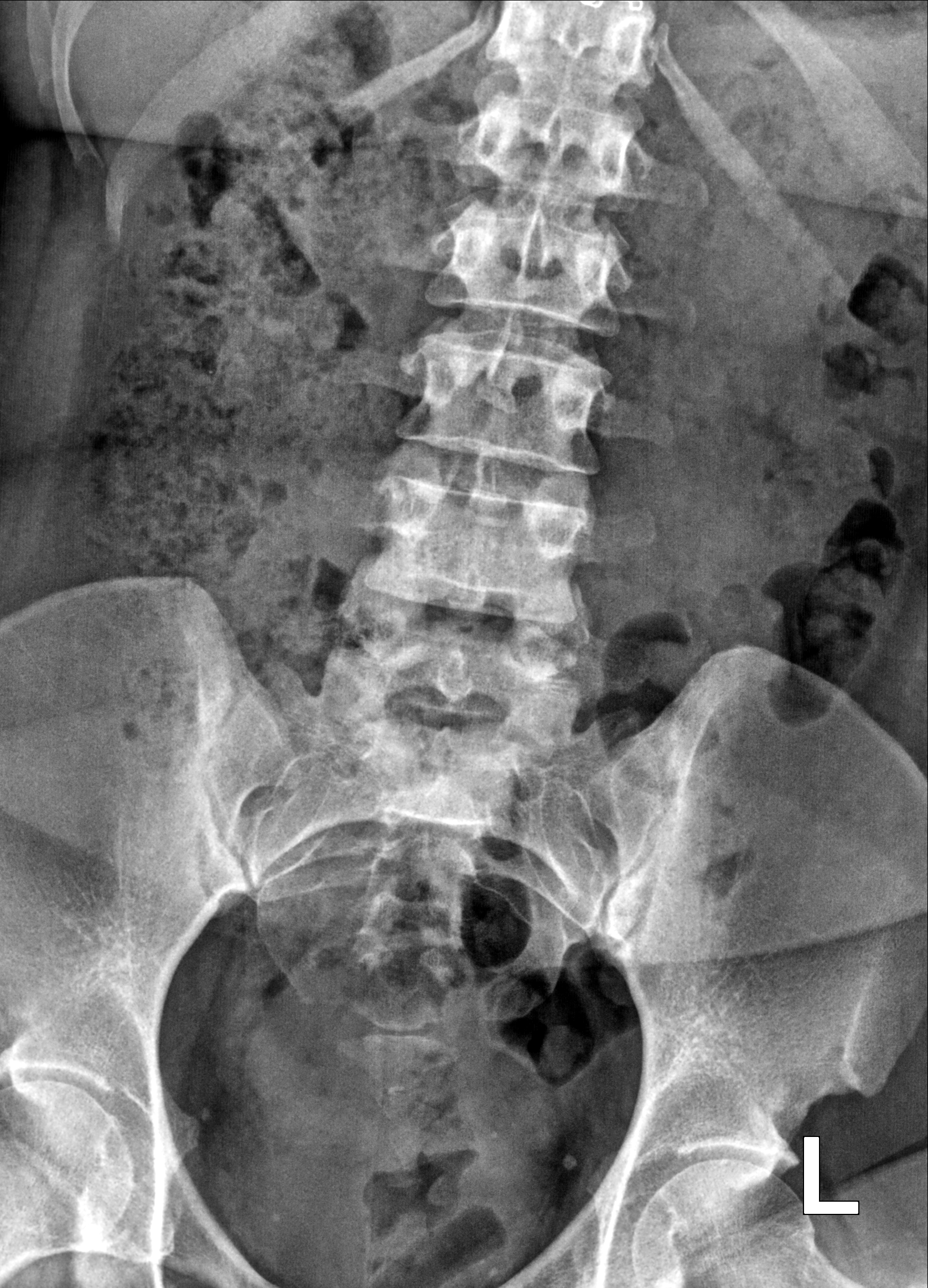

[lumbar spine oblique (1 of 2)]
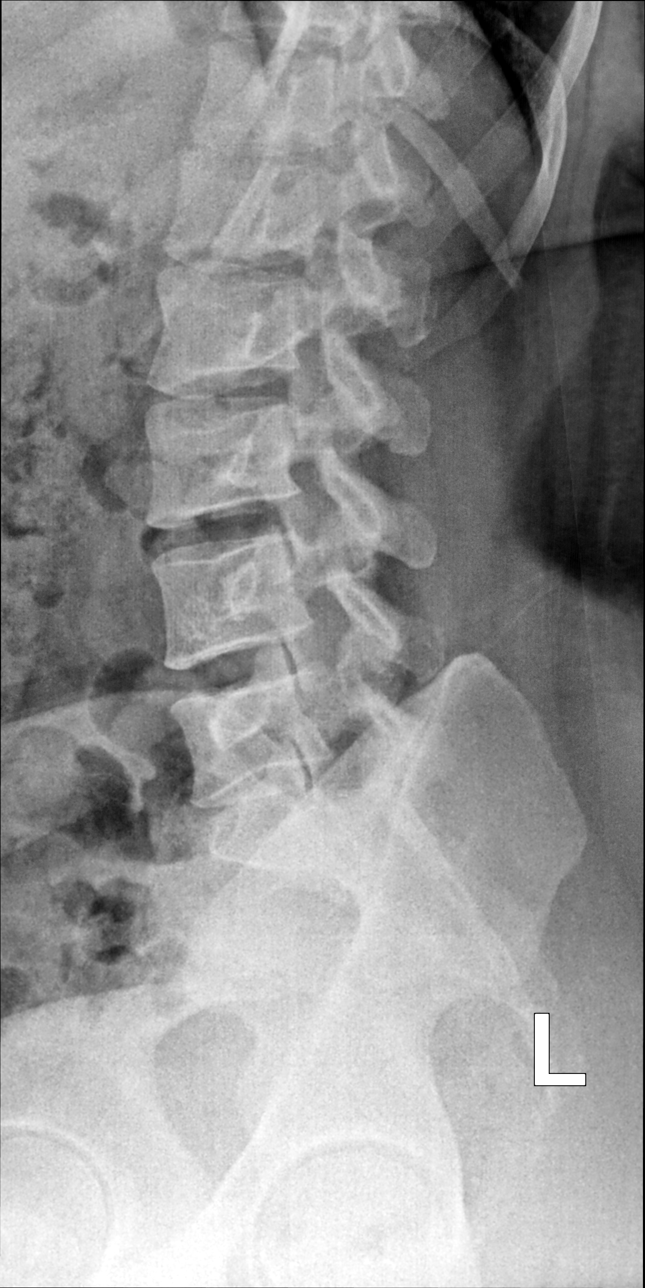

[lumbar spine oblique (2 of 2)]
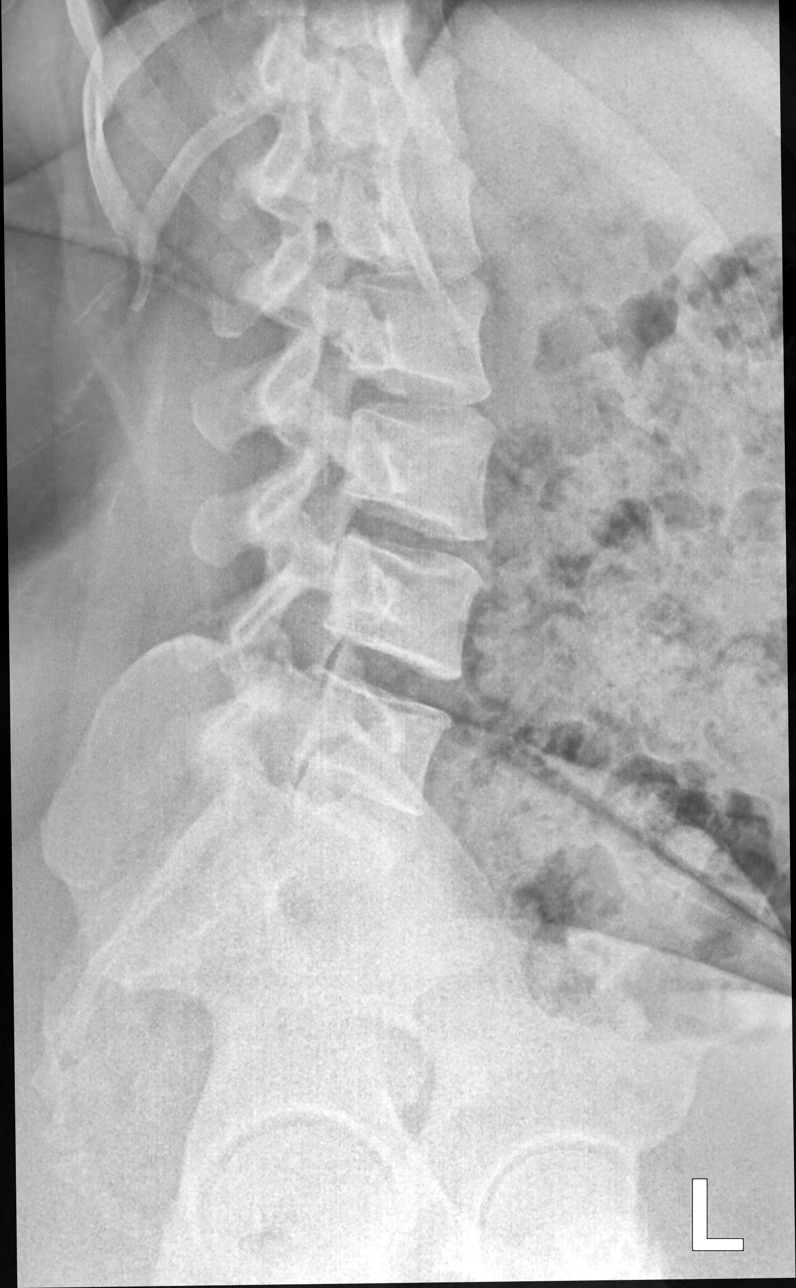

[lumbar spine lat]
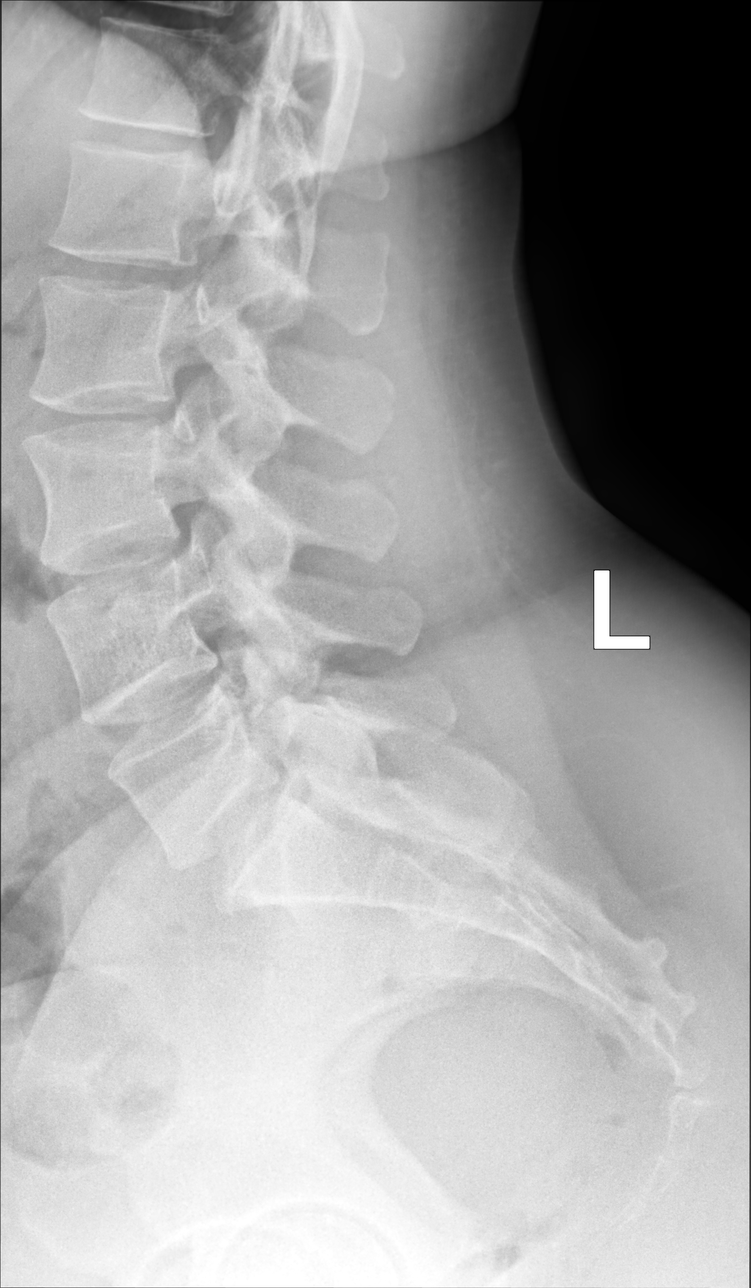

[lumbar spine lat spot]
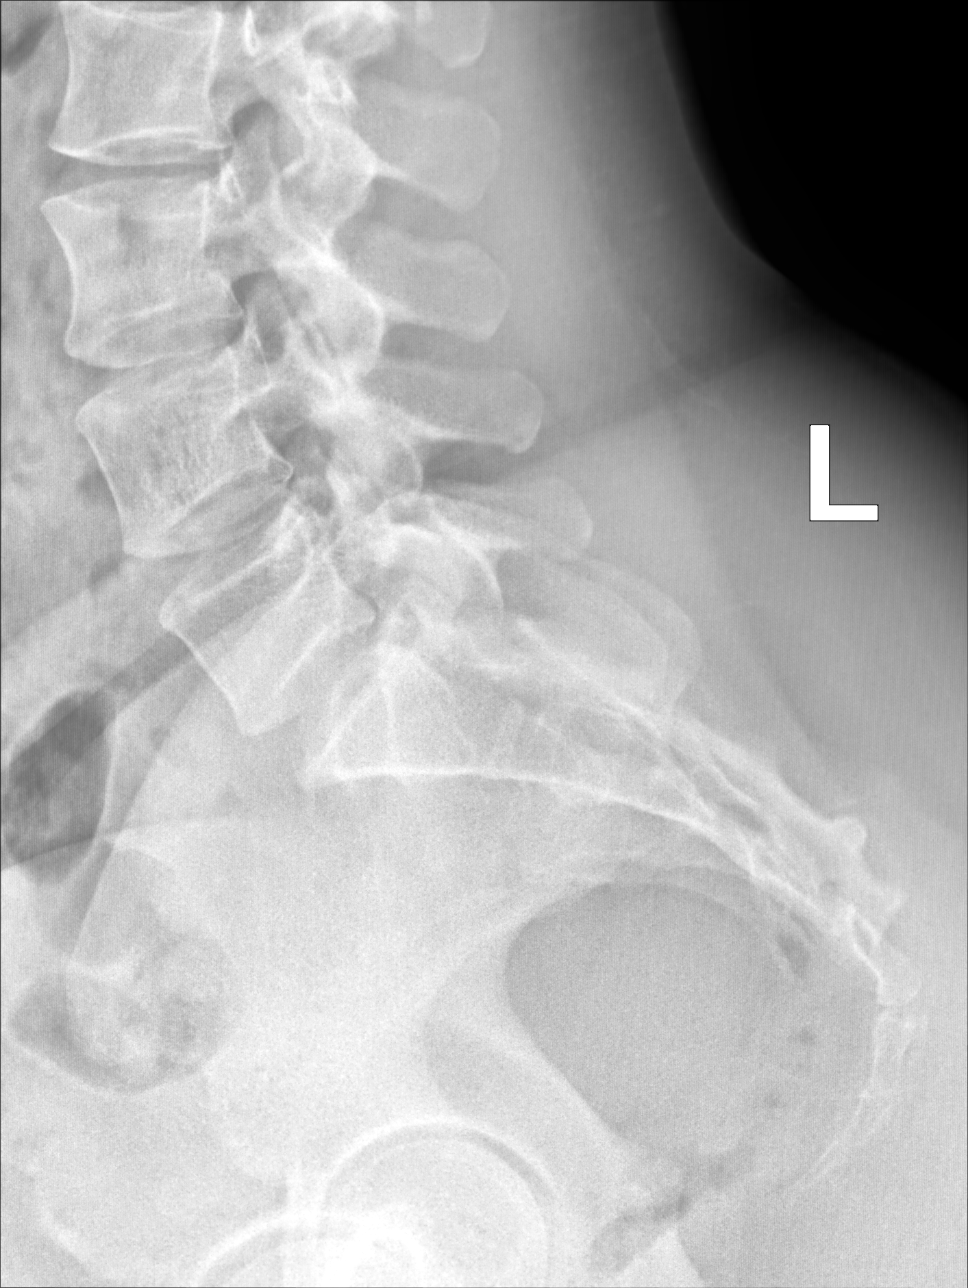

[5 of 5 positions shown; findings below may reference images not displayed]

FINDINGS: Mild retrolisthesis of L3 on L4.  2 mm

Mild facet arthropathy at L5-S1.

Vertebral body heights and disc spaces are maintained. Five lumbar
type vertebral bodies.
IMPRESSION: 1. Mild facet arthropathy at L5-S1.
2. Mild retrolisthesis of L3 on L4.
3. No acute findings.

## 2023-01-19 ENCOUNTER — Other Ambulatory Visit: Payer: Self-pay | Admitting: Family Medicine

## 2023-01-19 DIAGNOSIS — J3089 Other allergic rhinitis: Secondary | ICD-10-CM

## 2023-01-30 DIAGNOSIS — Z419 Encounter for procedure for purposes other than remedying health state, unspecified: Secondary | ICD-10-CM | POA: Diagnosis not present

## 2023-02-28 DIAGNOSIS — Z419 Encounter for procedure for purposes other than remedying health state, unspecified: Secondary | ICD-10-CM | POA: Diagnosis not present

## 2023-03-06 ENCOUNTER — Encounter: Payer: Self-pay | Admitting: Physician Assistant

## 2023-03-06 ENCOUNTER — Other Ambulatory Visit: Payer: Self-pay | Admitting: Physician Assistant

## 2023-03-06 DIAGNOSIS — N6312 Unspecified lump in the right breast, upper inner quadrant: Secondary | ICD-10-CM

## 2023-03-31 DIAGNOSIS — Z419 Encounter for procedure for purposes other than remedying health state, unspecified: Secondary | ICD-10-CM | POA: Diagnosis not present

## 2023-04-28 ENCOUNTER — Ambulatory Visit
Admission: RE | Admit: 2023-04-28 | Discharge: 2023-04-28 | Disposition: A | Discharge status: Home / Self Care | Payer: Medicaid Other | Source: Ambulatory Visit | Attending: Physician Assistant | Admitting: Physician Assistant

## 2023-04-28 ENCOUNTER — Encounter: Payer: Self-pay | Admitting: Radiology

## 2023-04-28 DIAGNOSIS — N6312 Unspecified lump in the right breast, upper inner quadrant: Secondary | ICD-10-CM

## 2023-04-28 DIAGNOSIS — R92333 Mammographic heterogeneous density, bilateral breasts: Secondary | ICD-10-CM | POA: Diagnosis not present

## 2023-04-28 DIAGNOSIS — N6489 Other specified disorders of breast: Secondary | ICD-10-CM | POA: Diagnosis not present

## 2023-04-30 DIAGNOSIS — Z419 Encounter for procedure for purposes other than remedying health state, unspecified: Secondary | ICD-10-CM | POA: Diagnosis not present

## 2023-05-31 DIAGNOSIS — Z419 Encounter for procedure for purposes other than remedying health state, unspecified: Secondary | ICD-10-CM | POA: Diagnosis not present

## 2023-06-30 DIAGNOSIS — Z419 Encounter for procedure for purposes other than remedying health state, unspecified: Secondary | ICD-10-CM | POA: Diagnosis not present

## 2023-09-10 ENCOUNTER — Other Ambulatory Visit: Payer: Self-pay | Admitting: Physician Assistant

## 2023-09-10 DIAGNOSIS — R921 Mammographic calcification found on diagnostic imaging of breast: Secondary | ICD-10-CM

## 2024-02-04 IMAGING — US US OB < 14 WEEKS - US OB TV
1 series · 15 of 28 positions shown · non-contrast
Comparison: None.

CLINICAL DATA: Vaginal bleeding in 1st trimester pregnancy.

EXAM:
OBSTETRIC <14 WK US AND TRANSVAGINAL OB US
TECHNIQUE: Both transabdominal and transvaginal ultrasound examinations were
performed for complete evaluation of the gestation as well as the
maternal uterus, adnexal regions, and pelvic cul-de-sac.
Transvaginal technique was performed to assess early pregnancy.

[Series 1: us ob < 14 weeks - us ob tv · 15 of 43 slices shown]
[im 1/43]
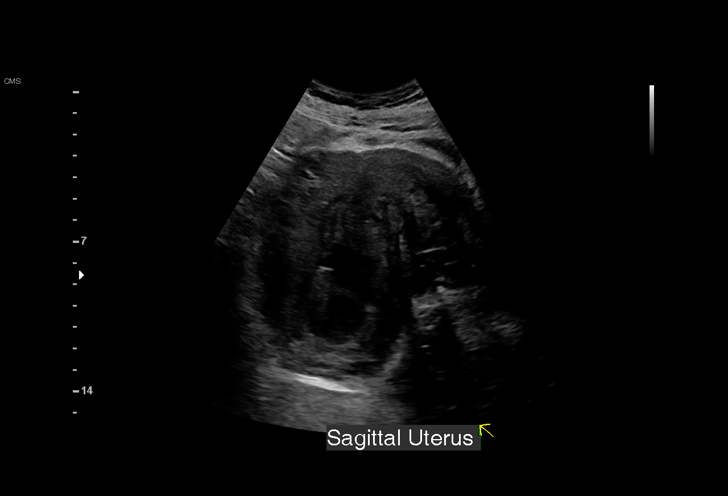
[im 4/43]
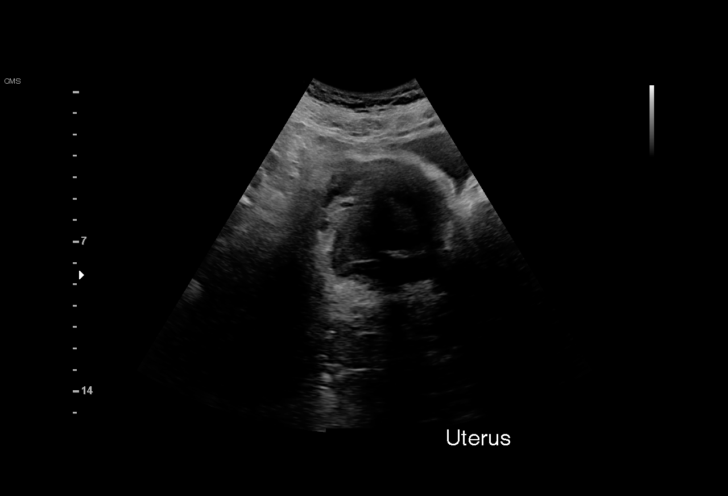
[im 7/43]
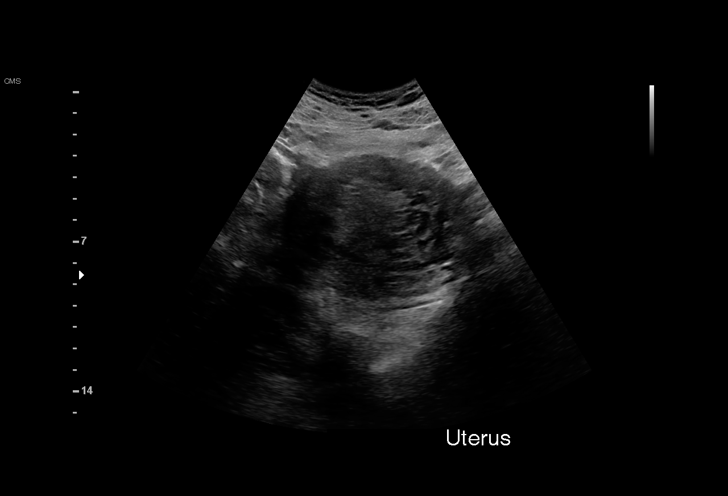
[im 10/43]
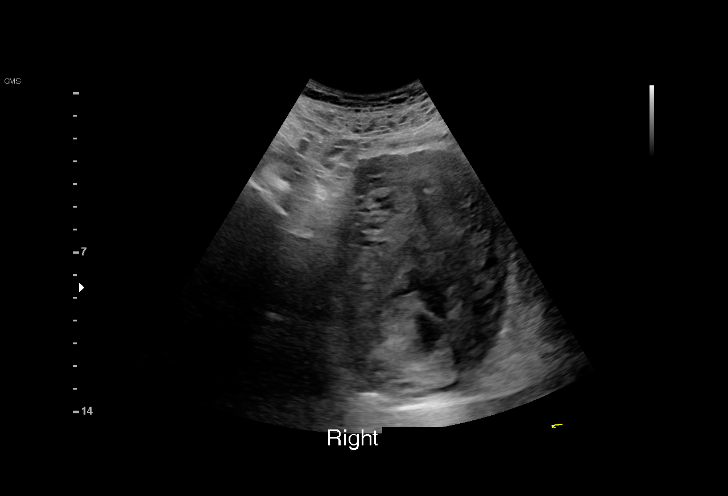
[im 13/43]
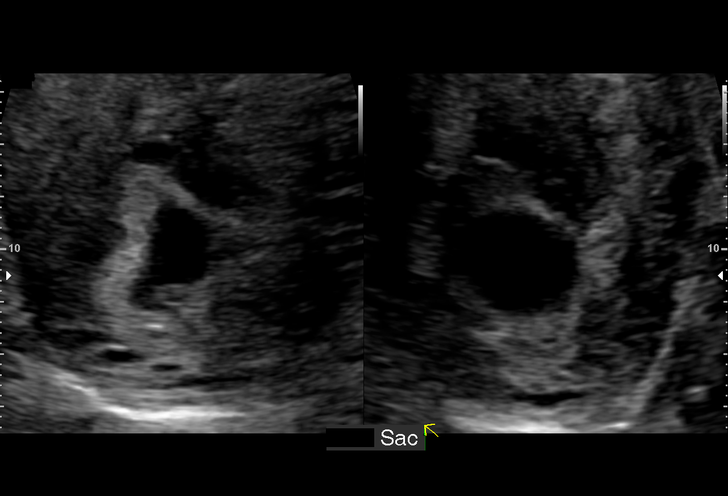
[im 16/43]
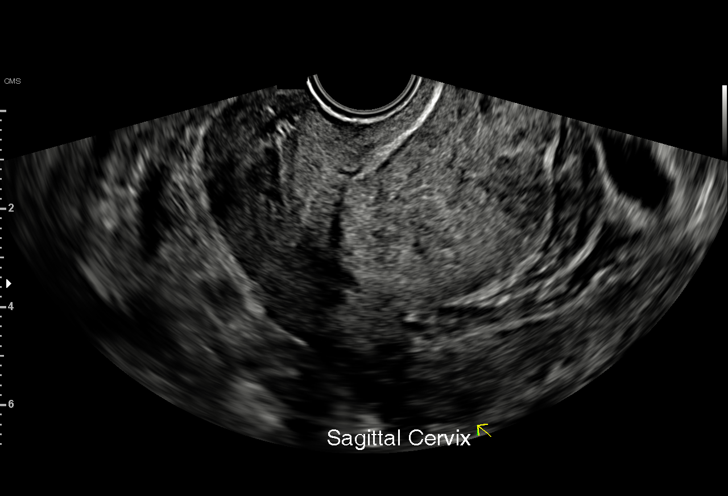
[im 19/43]
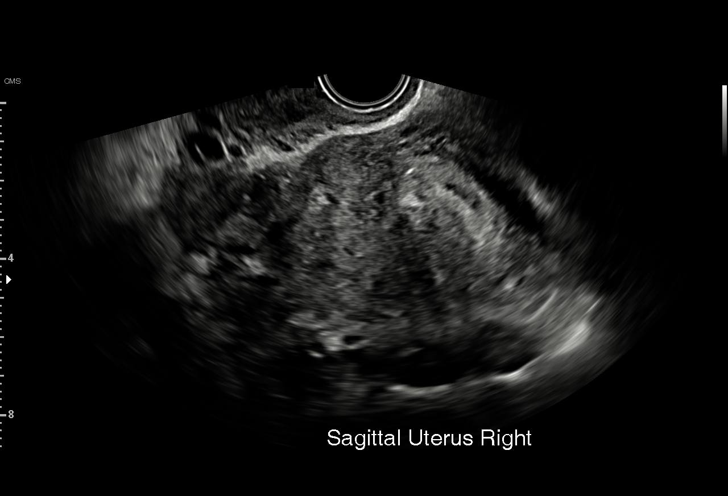
[im 22/43]
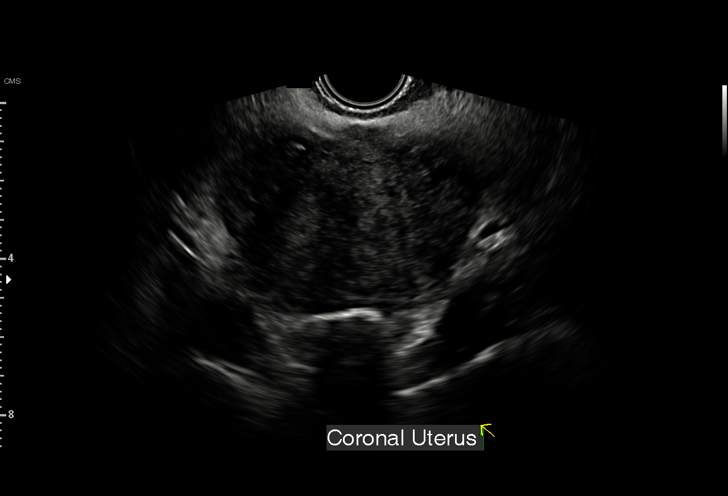
[im 24/43]
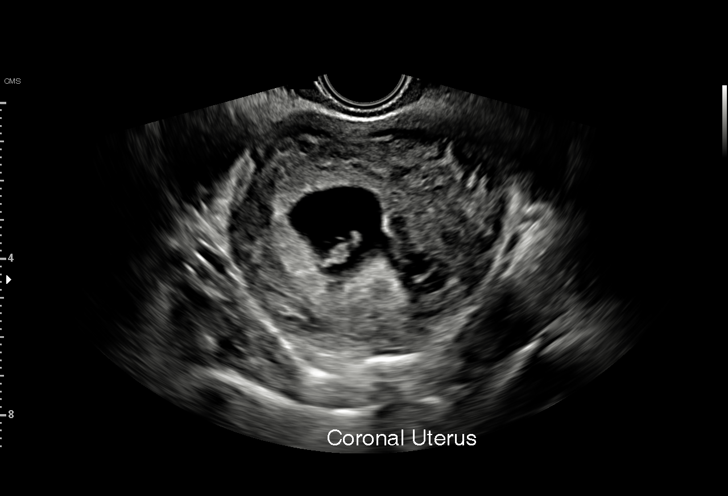
[im 27/43]
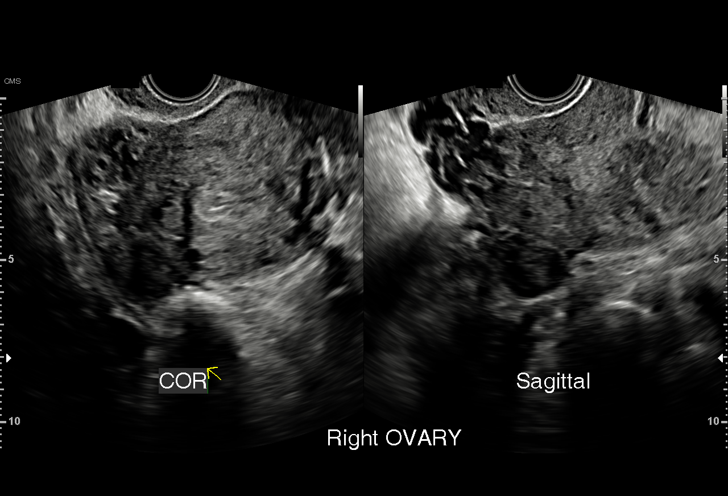
[im 30/43]
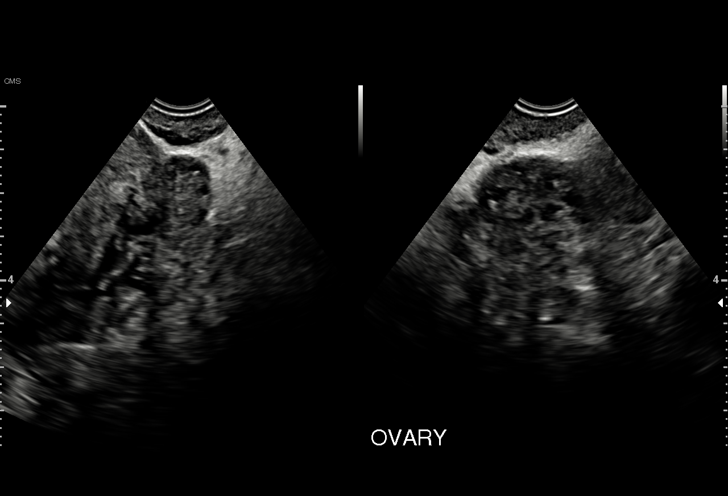
[im 33/43]
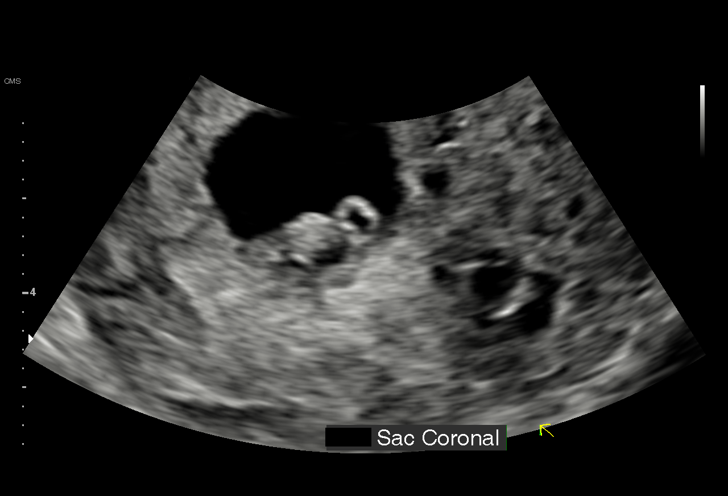
[im 36/43]
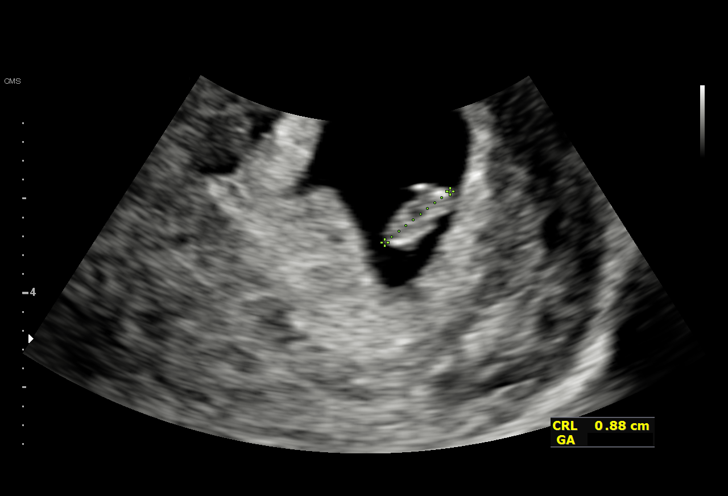
[im 39/43]
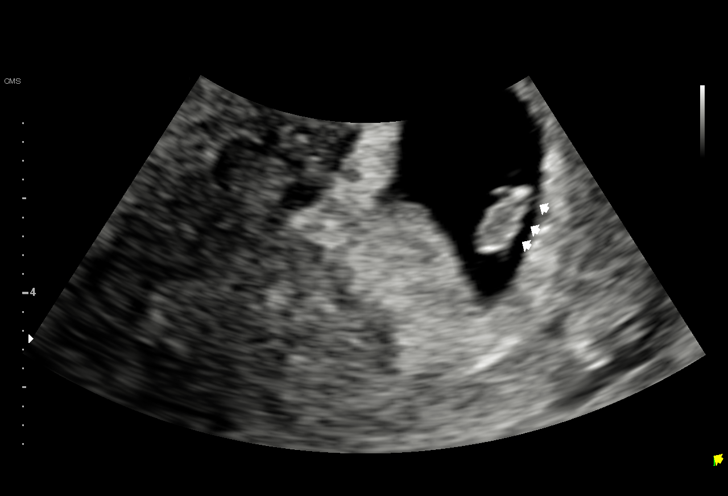
[im 43/43]
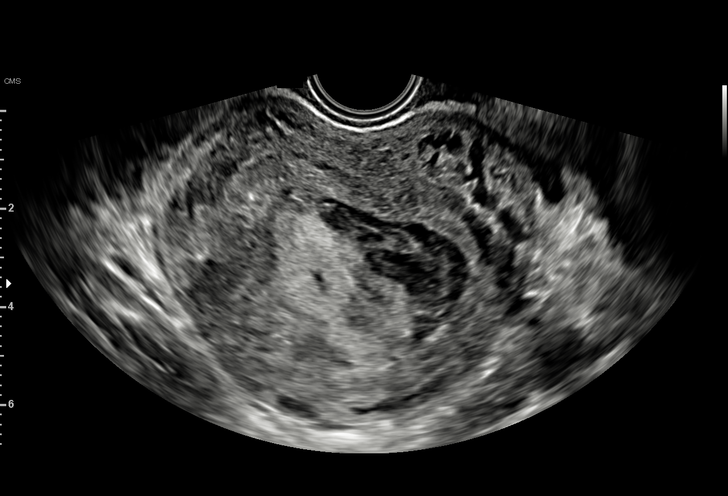

[15 of 28 positions shown; findings below may reference images not displayed]

FINDINGS: Intrauterine gestational sac: Single

Yolk sac:  Visualized.

Embryo:  Visualized.

Cardiac Activity: Visualized.

Heart Rate: 130 bpm

CRL:  9 mm   6 w   5 d                  US EDC: 12/08/2022

Subchorionic hemorrhage:  Moderate subchorionic hemorrhage is noted.

Maternal uterus/adnexae: Both ovaries are normal in appearance. No
mass or abnormal free fluid identified.
IMPRESSION: Single living IUP with estimated gestational age of 6 weeks 5 days,
and US EDC of 12/08/2022.

Moderate subchorionic hemorrhage noted.

## 2024-06-21 ENCOUNTER — Encounter: Payer: Self-pay | Admitting: Obstetrics

## 2024-06-25 ENCOUNTER — Encounter: Payer: Self-pay | Admitting: Obstetrics and Gynecology
